# Patient Record
Sex: Male | Born: 1950 | Race: White | Hispanic: No | Marital: Married | Smoking: Never smoker
Health system: Southern US, Community
[De-identification: ages and names within clinical notes are randomized; demographics above are authoritative.]

## PROBLEM LIST (undated history)

## (undated) DIAGNOSIS — I10 Essential (primary) hypertension: Secondary | ICD-10-CM

## (undated) DIAGNOSIS — C61 Malignant neoplasm of prostate: Secondary | ICD-10-CM

## (undated) DIAGNOSIS — E785 Hyperlipidemia, unspecified: Secondary | ICD-10-CM

## (undated) DIAGNOSIS — I214 Non-ST elevation (NSTEMI) myocardial infarction: Secondary | ICD-10-CM

## (undated) HISTORY — DX: Hyperlipidemia, unspecified: E78.5

## (undated) HISTORY — DX: Non-ST elevation (NSTEMI) myocardial infarction: I21.4

## (undated) HISTORY — PX: TONSILLECTOMY: SUR1361

---

## 1985-03-02 HISTORY — PX: TENDON REPAIR: SHX5111

## 2013-03-02 HISTORY — PX: CATARACT EXTRACTION: SUR2

## 2015-06-20 MED FILL — IBUPROFEN 600 MG TABLET: 600 | 10 days supply | Qty: 40 | Fill #0

## 2015-06-20 MED FILL — traMADol HCL 50 MG TABS: 50 | 3 days supply | Qty: 20 | Fill #0

## 2015-06-20 MED FILL — AMOXICILLIN 500 MG CAPSULE: 500 | 7 days supply | Qty: 21 | Fill #0

## 2017-01-26 DIAGNOSIS — I10 Essential (primary) hypertension: Secondary | ICD-10-CM | POA: Diagnosis not present

## 2017-01-26 DIAGNOSIS — Z13220 Encounter for screening for lipoid disorders: Secondary | ICD-10-CM | POA: Diagnosis not present

## 2017-01-26 DIAGNOSIS — Z1211 Encounter for screening for malignant neoplasm of colon: Secondary | ICD-10-CM | POA: Diagnosis not present

## 2017-01-26 DIAGNOSIS — Z131 Encounter for screening for diabetes mellitus: Secondary | ICD-10-CM | POA: Diagnosis not present

## 2017-01-26 DIAGNOSIS — Z0000 Encounter for general adult medical examination without abnormal findings: Secondary | ICD-10-CM | POA: Diagnosis not present

## 2017-01-26 DIAGNOSIS — Z23 Encounter for immunization: Secondary | ICD-10-CM | POA: Diagnosis not present

## 2017-01-26 DIAGNOSIS — Z125 Encounter for screening for malignant neoplasm of prostate: Secondary | ICD-10-CM | POA: Diagnosis not present

## 2017-01-26 MED FILL — CARVEDILOL 12.5 MG TABLET: 12.5 | 30 days supply | Qty: 60 | Fill #0

## 2017-01-27 DIAGNOSIS — I10 Essential (primary) hypertension: Secondary | ICD-10-CM | POA: Diagnosis not present

## 2017-03-04 MED FILL — IBUPROFEN 600 MG TABLET: 600 | 10 days supply | Qty: 40 | Fill #0

## 2017-03-04 MED FILL — AMOXICILLIN 500 MG CAPSULE: 500 | 7 days supply | Qty: 21 | Fill #0

## 2017-03-05 DIAGNOSIS — R972 Elevated prostate specific antigen [PSA]: Secondary | ICD-10-CM | POA: Diagnosis not present

## 2017-03-15 MED FILL — CARVEDILOL 12.5 MG TABLET: 12.5 | 30 days supply | Qty: 60 | Fill #1

## 2017-04-13 DIAGNOSIS — Z1211 Encounter for screening for malignant neoplasm of colon: Secondary | ICD-10-CM | POA: Diagnosis not present

## 2017-04-20 DIAGNOSIS — R972 Elevated prostate specific antigen [PSA]: Secondary | ICD-10-CM | POA: Diagnosis not present

## 2017-04-20 MED FILL — SULFAMETHOXAZOLE-TMP DS TAB: 800-160 | 1 days supply | Qty: 2 | Fill #0

## 2017-04-29 MED FILL — CARVEDILOL 25 MG TABLET: 25 | 90 days supply | Qty: 180 | Fill #0

## 2017-05-13 DIAGNOSIS — R972 Elevated prostate specific antigen [PSA]: Secondary | ICD-10-CM | POA: Diagnosis not present

## 2017-05-13 HISTORY — PX: PROSTATE BIOPSY: SHX241

## 2017-05-31 DIAGNOSIS — C61 Malignant neoplasm of prostate: Secondary | ICD-10-CM | POA: Diagnosis not present

## 2017-06-02 ENCOUNTER — Encounter: Payer: Self-pay | Admitting: Radiation Oncology

## 2017-06-14 ENCOUNTER — Institutional Professional Consult (permissible substitution): Payer: Self-pay | Admitting: Radiation Oncology

## 2017-06-21 ENCOUNTER — Encounter: Payer: Self-pay | Admitting: Medical Oncology

## 2017-06-21 ENCOUNTER — Encounter: Payer: Self-pay | Admitting: Radiation Oncology

## 2017-06-21 ENCOUNTER — Ambulatory Visit
Admission: RE | Admit: 2017-06-21 | Discharge: 2017-06-21 | Disposition: A | Discharge status: Home / Self Care | Payer: 59 | Source: Ambulatory Visit | Attending: Radiation Oncology | Admitting: Radiation Oncology

## 2017-06-21 ENCOUNTER — Other Ambulatory Visit: Payer: Self-pay

## 2017-06-21 VITALS — BP 183/97 | HR 61 | Temp 98.1°F | Resp 18 | Wt 214.2 lb

## 2017-06-21 DIAGNOSIS — R972 Elevated prostate specific antigen [PSA]: Secondary | ICD-10-CM | POA: Diagnosis not present

## 2017-06-21 DIAGNOSIS — Z809 Family history of malignant neoplasm, unspecified: Secondary | ICD-10-CM

## 2017-06-21 DIAGNOSIS — Z800 Family history of malignant neoplasm of digestive organs: Secondary | ICD-10-CM | POA: Diagnosis not present

## 2017-06-21 DIAGNOSIS — C61 Malignant neoplasm of prostate: Secondary | ICD-10-CM

## 2017-06-21 DIAGNOSIS — Z8042 Family history of malignant neoplasm of prostate: Secondary | ICD-10-CM | POA: Insufficient documentation

## 2017-06-21 DIAGNOSIS — Z803 Family history of malignant neoplasm of breast: Secondary | ICD-10-CM | POA: Insufficient documentation

## 2017-06-21 DIAGNOSIS — Z808 Family history of malignant neoplasm of other organs or systems: Secondary | ICD-10-CM | POA: Diagnosis not present

## 2017-06-21 HISTORY — DX: Essential (primary) hypertension: I10

## 2017-06-21 HISTORY — DX: Malignant neoplasm of prostate: C61

## 2017-06-21 NOTE — Progress Notes (Signed)
Introduced myself to Brendan Jackson and his wife as the prostate nurse navigator and my role. He states he was diagnosed in March and has discussed surgery with Dr. Tresa Moore and here today to discuss radiation. He is leaning towards surgery but encouraged to to hear about radiation. I gave them my business card and asked them to call me with questions and or concerns.

## 2017-06-21 NOTE — Progress Notes (Signed)
See progress note under physician encounter. 

## 2017-06-21 NOTE — Progress Notes (Addendum)
GU Location of Tumor / Histology: prostatic adenocarcinoma  If Prostate Cancer, Gleason Score is (4 + 3) and PSA is (13.3) on 04/2017. Prostate volume: 50 mL  Lillia Corporal was referred by his PCP, Teressa Lower (office in [place]),to Dr. Tresa Moore for further evaluation of an elevated PSA and small cyst mid lobe and median lobe in February 2019.   04/2017   PSA  12.38 12/2016  PSA  13.37  Biopsies revealed:    Past/Anticipated interventions by urology, if any:  05/13/2017: PROSTATE NEEDLE BIOPSY, referral to radiation oncology  Past/Anticipated interventions by medical oncology, if any: none  Weight changes, if any: no  Bowel/Bladder complaints, if any:  IPSS 7. Confirms urinary urgency. Denies dysuria or hematuria.    Nausea/Vomiting, if any: no  Pain issues, if any: no   SAFETY ISSUES:  Prior radiation? no  Pacemaker/ICD? no  Possible current pregnancy? No  Is the patient on methotrexate? no  Current Complaints / other details:  67 year old male. Married. Works a Advertising copywriter. Wife has worked in Orthoptist at [employer] for  20+ years. Resides in [place] with his wife and grown daughter.

## 2017-06-21 NOTE — Progress Notes (Signed)
Radiation Oncology         [phone] ________________________________  Initial Outpatient Consultation  Name: Brendan Jackson MRN: [id number]  Date: 06/21/2017  DOB: [birth year]  JI:RCVEL, Jaymes Graff, MD  Alexis Frock, MD   REFERRING PHYSICIAN: Alexis Frock, MD  DIAGNOSIS: 67 y.o. 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 Malignant neoplasm of prostate ([place]) C61   2. Family history of cancer Z80.9     HISTORY OF PRESENT ILLNESS: Brendan Jackson is a 67 y.o. male with a new diagnosis of prostate cancer, seen at the request of Dr. Tresa Moore. He was noted to have a persistently elevated PSA of 13.37 in November 2018 and 12.38 in January 2019 by his primary care physician, Dr. Teressa Lower.  Accordingly, he was referred for evaluation in urology to Dr. Tresa Moore on 04/20/2017, where a digital rectal examination was performed at that time revealing right apical induration.  The patient proceeded to transrectal ultrasound with 12 biopsies of the prostate on 05/13/2017.  The prostate volume measured 50 cc with mid lobe small cyst and early median lobe.  Out of 12 core biopsies, 6 were positive.  The maximum Gleason score was 4+3, and this was seen in the right mid, right apex, right base lateral, right mid lateral, and right apex lateral.    The patient reviewed the biopsy results with his urologist and he has kindly been referred today for discussion of potential radiation treatment options. He is accompanied by his wife who is a Production designer, theatre/television/film in cardiology.   PREVIOUS RADIATION THERAPY: No  PAST MEDICAL HISTORY:  Past Medical History:  Diagnosis Date  . Hypertension   . Prostate cancer ([place])       PAST SURGICAL HISTORY: Past Surgical History:  Procedure Laterality Date  . CATARACT EXTRACTION  2015  . PROSTATE BIOPSY  05/13/2017    FAMILY HISTORY:  Family History  Problem Relation Age of Onset  . Cancer  Paternal Aunt        breast  . Cancer Paternal Uncle        prostate and brain  . Cancer Paternal Uncle        head and neck ca with lung involvement  . Cancer Paternal Aunt        breast  . Cancer Father        pancreatic    SOCIAL HISTORY:  Social History   Socioeconomic History  . Marital status: Married    Spouse name: [name]  . Number of children: 1  . Years of education: Not on file  . Highest education level: Not on file  Occupational History    Comment: chicken and cattle farmer  Social Needs  . Financial resource strain: Not on file  . Food insecurity:    Worry: Not on file    Inability: Not on file  . Transportation needs:    Medical: Not on file    Non-medical: Not on file  Tobacco Use  . Smoking status: Never Smoker  . Smokeless tobacco: Former Systems developer    Types: Chew  Substance and Sexual Activity  . Alcohol use: Never    Frequency: Never  . Drug use: Never  . Sexual activity: Yes  Lifestyle  . Physical activity:    Days per week: Not on file    Minutes per session: Not on file  . Stress: Not on file  Relationships  .  Social connections:    Talks on phone: Not on file    Gets together: Not on file    Attends religious service: Not on file    Active member of club or organization: Not on file    Attends meetings of clubs or organizations: Not on file    Relationship status: Not on file  . Intimate partner violence:    Fear of current or ex partner: Not on file    Emotionally abused: Not on file    Physically abused: Not on file    Forced sexual activity: Not on file  Other Topics Concern  . Not on file  Social History Narrative   Resides in [place] with his wife, grown daughter and dogs, Margaretmary [place], [place] and [place].     ALLERGIES: Patient has no known allergies.  MEDICATIONS:  Current Outpatient Medications  Medication Sig Dispense Refill  . aspirin EC 81 MG tablet Take 81 mg by mouth daily.    . carvedilol (COREG) 12.5 MG tablet Take 12.5 mg by  mouth 2 (two) times daily with a meal.     No current facility-administered medications for this encounter.     REVIEW OF SYSTEMS:  On review of systems, the patient reports that he is doing well overall. He denies any chest pain, shortness of breath, cough, fevers, chills, night sweats, or unintended weight changes. He denies any bowel disturbances, and denies abdominal pain, nausea or vomiting. He denies any new musculoskeletal or joint aches or pains. His IPSS was 7, indicating mild urinary symptoms with urgency. He denies any dysuria or hematuria. He is able to complete sexual activity with most attempts. A complete review of systems is obtained and is otherwise negative.    PHYSICAL EXAM:  Wt Readings from Last 3 Encounters:  06/21/17 214 lb 3.2 oz (97.2 kg)   Temp Readings from Last 3 Encounters:  06/21/17 98.1 F (36.7 C) (Oral)   BP Readings from Last 3 Encounters:  06/21/17 (!) 183/97   Pulse Readings from Last 3 Encounters:  06/21/17 61      In general this is a well appearing caucasian male in no acute distress. He is alert and oriented x4 and appropriate throughout the examination. HEENT reveals that the patient is normocephalic, atraumatic. EOMs are intact. PERRLA. Skin is intact without any evidence of gross lesions. Cardiopulmonary assessment is negative for acute distress and he exhibits normal effort.    KPS = 100  100 - Normal; no complaints; no evidence of disease. 90   - Able to carry on normal activity; minor signs or symptoms of disease. 80   - Normal activity with effort; some signs or symptoms of disease. 51   - Cares for self; unable to carry on normal activity or to do active work. 60   - Requires occasional assistance, but is able to care for most of his personal needs. 50   - Requires considerable assistance and frequent medical care. 1   - Disabled; requires special care and assistance. 60   - Severely disabled; hospital admission is indicated  although death not imminent. 14   - Very sick; hospital admission necessary; active supportive treatment necessary. 10   - Moribund; fatal processes progressing rapidly. 0     - Dead  Karnofsky DA, Abelmann WH, Craver LS and Burchenal [facility] [phone]) The use of the nitrogen mustards in the palliative treatment of carcinoma: with particular reference to bronchogenic carcinoma Cancer 1 [phone]  LABORATORY DATA:  No results  found for: WBC, HGB, HCT, MCV, PLT No results found for: NA, K, CL, CO2 No results found for: ALT, AST, GGT, ALKPHOS, BILITOT   RADIOGRAPHY: No results found.    IMPRESSION/PLAN: 1. 67 y.o. gentleman with Stage T1c adenocarcinoma of the prostate with Gleason Score of 4+3, and PSA of 12.38. We discussed the pathology findings and reviews the nature of prostate cancer highlighting T staging, Gleason Score, and PSA as it pertains to diagnosis and management of prostate cancer. The patient is elligible for surgical resection with prostatectomy, external beam radiotherapy, or radioactive seed implant. We discussed the risks, benefits, short, and long term effects of each approach of radiotherapy, comparing and contrasting surgical resection. At the end of the visit, the patient is going to go home and think about his options. I will call him in the next week or so to see what choice he'll make for treatment.  2. Possible genetic predisposition to malignancy. The patient is a candidate for genetic testing given his personal and family history. He was offered referral and would like to proceed with testing. A referral will be placed.  In a visit lasting 60 minutes, greater than 50% of the time was spent face to face discussing his case, and coordinating the patient's care.    Carola Rhine, [facility]   Page Me  Seen with  _____________________________________  Sheral Apley Tammi Klippel, M.D.  This document serves as a record of services personally performed by Tyler Pita, MD and Shona Simpson, PA-C. It was created on their behalf by Rae Lips, a trained medical scribe. The creation of this record is based on the scribe's personal observations and the providers' statements to them. This document has been checked and approved by the attending providers.

## 2017-06-23 ENCOUNTER — Other Ambulatory Visit: Payer: Self-pay | Admitting: Radiation Oncology

## 2017-06-23 DIAGNOSIS — Z809 Family history of malignant neoplasm, unspecified: Secondary | ICD-10-CM

## 2017-06-24 ENCOUNTER — Telehealth: Payer: Self-pay | Admitting: Radiation Oncology

## 2017-06-24 NOTE — Telephone Encounter (Signed)
Appointments scheduled letter/calendar mailed to patient per 4/25 sch msg

## 2017-06-30 ENCOUNTER — Telehealth: Payer: Self-pay | Admitting: Radiation Oncology

## 2017-06-30 NOTE — Telephone Encounter (Signed)
I called and spoke with the patient to determine if he'd made a decision for treatment yet. He's still considering, though leaning toward surgery and asked that I call back next week to check on him.

## 2017-07-06 DIAGNOSIS — C61 Malignant neoplasm of prostate: Secondary | ICD-10-CM | POA: Diagnosis not present

## 2017-07-06 DIAGNOSIS — R972 Elevated prostate specific antigen [PSA]: Secondary | ICD-10-CM | POA: Diagnosis not present

## 2017-07-12 ENCOUNTER — Telehealth: Payer: Self-pay | Admitting: Medical Oncology

## 2017-07-12 ENCOUNTER — Other Ambulatory Visit: Payer: Self-pay | Admitting: Urology

## 2017-07-12 NOTE — Telephone Encounter (Signed)
Left message asking for a return call regarding treatment decision for his prostate cancer. He has follow up appointment 5/7 with Dr. Tresa Moore to further discuss robotic prostatectomy.

## 2017-07-14 ENCOUNTER — Telehealth: Payer: Self-pay | Admitting: Radiation Oncology

## 2017-07-14 NOTE — Telephone Encounter (Signed)
LM for pt to call back if he had any questions. It appears he's proceeding with surgery on 09/08/17. I encouraged him in the message to call us if he has any questions regarding our last meeting.

## 2017-07-20 DIAGNOSIS — M6281 Muscle weakness (generalized): Secondary | ICD-10-CM | POA: Diagnosis not present

## 2017-07-20 DIAGNOSIS — C61 Malignant neoplasm of prostate: Secondary | ICD-10-CM | POA: Diagnosis not present

## 2017-08-09 DIAGNOSIS — N393 Stress incontinence (female) (male): Secondary | ICD-10-CM | POA: Diagnosis not present

## 2017-08-09 DIAGNOSIS — M6281 Muscle weakness (generalized): Secondary | ICD-10-CM | POA: Diagnosis not present

## 2017-08-09 DIAGNOSIS — M62838 Other muscle spasm: Secondary | ICD-10-CM | POA: Diagnosis not present

## 2017-08-10 ENCOUNTER — Other Ambulatory Visit: Payer: 59

## 2017-08-10 ENCOUNTER — Encounter: Payer: 59 | Admitting: Genetics

## 2017-08-24 DIAGNOSIS — C61 Malignant neoplasm of prostate: Secondary | ICD-10-CM | POA: Diagnosis not present

## 2017-08-26 DIAGNOSIS — Z13220 Encounter for screening for lipoid disorders: Secondary | ICD-10-CM | POA: Diagnosis not present

## 2017-08-26 DIAGNOSIS — I10 Essential (primary) hypertension: Secondary | ICD-10-CM | POA: Diagnosis not present

## 2017-08-26 DIAGNOSIS — Z131 Encounter for screening for diabetes mellitus: Secondary | ICD-10-CM | POA: Diagnosis not present

## 2017-08-26 DIAGNOSIS — C61 Malignant neoplasm of prostate: Secondary | ICD-10-CM | POA: Diagnosis not present

## 2017-08-27 NOTE — Patient Instructions (Signed)
Brendan Jackson  08/27/2017   Your procedure is scheduled on: 09-08-17   Report to [facility] Main  Entrance    Report to Admitting at 6:30 AM    Call this number if you have problems the morning of surgery [phone]   Remember: Do not eat food or drink liquids :After Midnight.   Please follow a Clear Liquid Diet the Day Before Surgery  CLEAR LIQUID DIET   Foods Allowed                                                                     Foods Excluded  Coffee and tea, regular and decaf                             liquids that you cannot  Plain Jell-O in any flavor                                             see through such as: Fruit ices (not with fruit pulp)                                     milk, soups, orange juice  Iced Popsicles                                    All solid food Carbonated beverages, regular and diet                                    Cranberry, grape and apple juices Sports drinks like Gatorade Lightly seasoned clear broth or consume(fat free) Sugar, honey syrup  Sample Menu Breakfast                                Lunch                                     Supper Cranberry juice                    Beef broth                            Chicken broth Jell-O                                     Grape juice                           Apple juice Coffee or tea  Jell-O                                      Popsicle                                                Coffee or tea                        Coffee or tea  _____________________________________________________________________     Take these medicines the morning of surgery with A SIP OF WATER: Carvedilol (Coreg)                                You may not have any metal on your body including hair pins and              piercings  Do not wear jewelry, lotions, powders or deodorant             Men may shave face and neck.   Do not bring valuables to the  hospital. [place].  Contacts, dentures or bridgework may not be worn into surgery.  Leave suitcase in the car. After surgery it may be brought to your room.      Special Instructions: N/A              Please read over the following fact sheets you were given: _____________________________________________________________________           [facility] - Preparing for Surgery Before surgery, you can play an important role.  Because skin is not sterile, your skin needs to be as free of germs as possible.  You can reduce the number of germs on your skin by washing with CHG (chlorahexidine gluconate) soap before surgery.  CHG is an antiseptic cleaner which kills germs and bonds with the skin to continue killing germs even after washing. Please DO NOT use if you have an allergy to CHG or antibacterial soaps.  If your skin becomes reddened/irritated stop using the CHG and inform your nurse when you arrive at Short Stay. Do not shave (including legs and underarms) for at least 48 hours prior to the first CHG shower.  You may shave your face/neck. Please follow these instructions carefully:  1.  Shower with CHG Soap the night before surgery and the  morning of Surgery.  2.  If you choose to wash your hair, wash your hair first as usual with your  normal  shampoo.  3.  After you shampoo, rinse your hair and body thoroughly to remove the  shampoo.                           4.  Use CHG as you would any other liquid soap.  You can apply chg directly  to the skin and wash                       Gently with a scrungie or clean washcloth.  5.  Apply the CHG Soap to your body ONLY FROM  THE NECK DOWN.   Do not use on face/ open                           Wound or open sores. Avoid contact with eyes, ears mouth and genitals (private parts).                       Wash face,  Genitals (private parts) with your normal soap.             6.  Wash thoroughly, paying  special attention to the area where your surgery  will be performed.  7.  Thoroughly rinse your body with warm water from the neck down.  8.  DO NOT shower/wash with your normal soap after using and rinsing off  the CHG Soap.                9.  Pat yourself dry with a clean towel.            10.  Wear clean pajamas.            11.  Place clean sheets on your bed the night of your first shower and do not  sleep with pets. Day of Surgery : Do not apply any lotions/deodorants the morning of surgery.  Please wear clean clothes to the hospital/surgery center.  FAILURE TO FOLLOW THESE INSTRUCTIONS MAY RESULT IN THE CANCELLATION OF YOUR SURGERY PATIENT SIGNATURE_________________________________  NURSE SIGNATURE__________________________________  ________________________________________________________________________  WHAT IS A BLOOD TRANSFUSION? Blood Transfusion Information  A transfusion is the replacement of blood or some of its parts. Blood is made up of multiple cells which provide different functions.  Red blood cells carry oxygen and are used for blood loss replacement.  White blood cells fight against infection.  Platelets control bleeding.  Plasma helps clot blood.  Other blood products are available for specialized needs, such as hemophilia or other clotting disorders. BEFORE THE TRANSFUSION  Who gives blood for transfusions?   Healthy volunteers who are fully evaluated to make sure their blood is safe. This is blood bank blood. Transfusion therapy is the safest it has ever been in the practice of medicine. Before blood is taken from a donor, a complete history is taken to make sure that person has no history of diseases nor engages in risky social behavior (examples are intravenous drug use or sexual activity with multiple partners). The donor's travel history is screened to minimize risk of transmitting infections, such as malaria. The donated blood is tested for signs of  infectious diseases, such as HIV and hepatitis. The blood is then tested to be sure it is compatible with you in order to minimize the chance of a transfusion reaction. If you or a relative donates blood, this is often done in anticipation of surgery and is not appropriate for emergency situations. It takes many days to process the donated blood. RISKS AND COMPLICATIONS Although transfusion therapy is very safe and saves many lives, the main dangers of transfusion include:   Getting an infectious disease.  Developing a transfusion reaction. This is an allergic reaction to something in the blood you were given. Every precaution is taken to prevent this. The decision to have a blood transfusion has been considered carefully by your caregiver before blood is given. Blood is not given unless the benefits outweigh the risks. AFTER THE TRANSFUSION  Right after receiving a blood transfusion, you will usually feel much better  and more energetic. This is especially true if your red blood cells have gotten low (anemic). The transfusion raises the level of the red blood cells which carry oxygen, and this usually causes an energy increase.  The nurse administering the transfusion will monitor you carefully for complications. HOME CARE INSTRUCTIONS  No special instructions are needed after a transfusion. You may find your energy is better. Speak with your caregiver about any limitations on activity for underlying diseases you may have. SEEK MEDICAL CARE IF:   Your condition is not improving after your transfusion.  You develop redness or irritation at the intravenous (IV) site. SEEK IMMEDIATE MEDICAL CARE IF:  Any of the following symptoms occur over the next 12 hours:  Shaking chills.  You have a temperature by mouth above 102 F (38.9 C), not controlled by medicine.  Chest, back, or muscle pain.  People around you feel you are not acting correctly or are confused.  Shortness of breath or  difficulty breathing.  Dizziness and fainting.  You get a rash or develop hives.  You have a decrease in urine output.  Your urine turns a dark color or changes to pink, red, or brown. Any of the following symptoms occur over the next 10 days:  You have a temperature by mouth above 102 F (38.9 C), not controlled by medicine.  Shortness of breath.  Weakness after normal activity.  The white part of the eye turns yellow (jaundice).  You have a decrease in the amount of urine or are urinating less often.  Your urine turns a dark color or changes to pink, red, or brown. Document Released: 02/14/2000 Document Revised: 05/11/2011 Document Reviewed: 10/03/2007 ExitCare Patient Information 2014 ExitCare, [place].  _______________________________________________________________________ WHAT IS A BLOOD TRANSFUSION? Blood Transfusion Information  A transfusion is the replacement of blood or some of its parts. Blood is made up of multiple cells which provide different functions.  Red blood cells carry oxygen and are used for blood loss replacement.  White blood cells fight against infection.  Platelets control bleeding.  Plasma helps clot blood.  Other blood products are available for specialized needs, such as hemophilia or other clotting disorders. BEFORE THE TRANSFUSION  Who gives blood for transfusions?   Healthy volunteers who are fully evaluated to make sure their blood is safe. This is blood bank blood. Transfusion therapy is the safest it has ever been in the practice of medicine. Before blood is taken from a donor, a complete history is taken to make sure that person has no history of diseases nor engages in risky social behavior (examples are intravenous drug use or sexual activity with multiple partners). The donor's travel history is screened to minimize risk of transmitting infections, such as malaria. The donated blood is tested for signs of infectious diseases, such as  HIV and hepatitis. The blood is then tested to be sure it is compatible with you in order to minimize the chance of a transfusion reaction. If you or a relative donates blood, this is often done in anticipation of surgery and is not appropriate for emergency situations. It takes many days to process the donated blood. RISKS AND COMPLICATIONS Although transfusion therapy is very safe and saves many lives, the main dangers of transfusion include:   Getting an infectious disease.  Developing a transfusion reaction. This is an allergic reaction to something in the blood you were given. Every precaution is taken to prevent this. The decision to have a blood transfusion has been considered  carefully by your caregiver before blood is given. Blood is not given unless the benefits outweigh the risks. AFTER THE TRANSFUSION  Right after receiving a blood transfusion, you will usually feel much better and more energetic. This is especially true if your red blood cells have gotten low (anemic). The transfusion raises the level of the red blood cells which carry oxygen, and this usually causes an energy increase.  The nurse administering the transfusion will monitor you carefully for complications. HOME CARE INSTRUCTIONS  No special instructions are needed after a transfusion. You may find your energy is better. Speak with your caregiver about any limitations on activity for underlying diseases you may have. SEEK MEDICAL CARE IF:   Your condition is not improving after your transfusion.  You develop redness or irritation at the intravenous (IV) site. SEEK IMMEDIATE MEDICAL CARE IF:  Any of the following symptoms occur over the next 12 hours:  Shaking chills.  You have a temperature by mouth above 102 F (38.9 C), not controlled by medicine.  Chest, back, or muscle pain.  People around you feel you are not acting correctly or are confused.  Shortness of breath or difficulty breathing.  Dizziness  and fainting.  You get a rash or develop hives.  You have a decrease in urine output.  Your urine turns a dark color or changes to pink, red, or brown. Any of the following symptoms occur over the next 10 days:  You have a temperature by mouth above 102 F (38.9 C), not controlled by medicine.  Shortness of breath.  Weakness after normal activity.  The white part of the eye turns yellow (jaundice).  You have a decrease in the amount of urine or are urinating less often.  Your urine turns a dark color or changes to pink, red, or brown. Document Released: 02/14/2000 Document Revised: 05/11/2011 Document Reviewed: 10/03/2007 [facility] Patient Information 2014 [place], [place].  _______________________________________________________________________

## 2017-08-31 ENCOUNTER — Encounter (HOSPITAL_COMMUNITY): Payer: Self-pay

## 2017-08-31 ENCOUNTER — Encounter (HOSPITAL_COMMUNITY)
Admission: RE | Admit: 2017-08-31 | Discharge: 2017-08-31 | Disposition: A | Discharge status: Home / Self Care | Payer: 59 | Source: Ambulatory Visit | Attending: Urology | Admitting: Urology

## 2017-08-31 ENCOUNTER — Other Ambulatory Visit: Payer: Self-pay

## 2017-08-31 DIAGNOSIS — Z01812 Encounter for preprocedural laboratory examination: Secondary | ICD-10-CM | POA: Insufficient documentation

## 2017-08-31 DIAGNOSIS — I10 Essential (primary) hypertension: Secondary | ICD-10-CM | POA: Insufficient documentation

## 2017-08-31 DIAGNOSIS — C61 Malignant neoplasm of prostate: Secondary | ICD-10-CM | POA: Insufficient documentation

## 2017-08-31 LAB — BASIC METABOLIC PANEL
Anion gap: 6 (ref 5–15)
BUN: 14 mg/dL (ref 8–23)
CHLORIDE: 108 mmol/L (ref 98–111)
CO2: 27 mmol/L (ref 22–32)
CREATININE: 0.92 mg/dL (ref 0.61–1.24)
Calcium: 9.2 mg/dL (ref 8.9–10.3)
GFR calc Af Amer: >60 mL/min (ref >60)
GFR calc non Af Amer: >60 mL/min (ref >60)
Glucose, Bld: 107 mg/dL — ABNORMAL HIGH (ref 70–99)
Potassium: 4.6 mmol/L (ref 3.5–5.1)
SODIUM: 141 mmol/L (ref 135–145)

## 2017-08-31 LAB — CBC
HCT: 45.3 % (ref 39.0–52.0)
Hemoglobin: 15.4 g/dL (ref 13.0–17.0)
MCH: 33 pg (ref 26.0–34.0)
MCHC: 34 g/dL (ref 30.0–36.0)
MCV: 97.2 fL (ref 78.0–100.0)
PLATELETS: 189 10*3/uL (ref 150–400)
RBC: 4.66 MIL/uL (ref 4.22–5.81)
RDW: 13.3 % (ref 11.5–15.5)
WBC: 6.8 10*3/uL (ref 4.0–10.5)

## 2017-08-31 LAB — ABO/RH: ABO/RH(D): AB POS

## 2017-09-03 MED FILL — CARVEDILOL 25 MG TABLET: 25 | 90 days supply | Qty: 180 | Fill #1

## 2017-09-05 ENCOUNTER — Inpatient Hospital Stay (HOSPITAL_COMMUNITY)
Admission: AD | Admit: 2017-09-05 | Discharge: 2017-09-14 | DRG: 234 | Disposition: A | Discharge status: Home Health | Payer: 59 | Source: Other Acute Inpatient Hospital | Attending: Cardiovascular Disease | Admitting: Cardiovascular Disease

## 2017-09-05 DIAGNOSIS — K5900 Constipation, unspecified: Secondary | ICD-10-CM | POA: Diagnosis not present

## 2017-09-05 DIAGNOSIS — E8770 Fluid overload, unspecified: Secondary | ICD-10-CM | POA: Diagnosis not present

## 2017-09-05 DIAGNOSIS — I2510 Atherosclerotic heart disease of native coronary artery without angina pectoris: Secondary | ICD-10-CM | POA: Diagnosis not present

## 2017-09-05 DIAGNOSIS — Z8249 Family history of ischemic heart disease and other diseases of the circulatory system: Secondary | ICD-10-CM | POA: Diagnosis not present

## 2017-09-05 DIAGNOSIS — E669 Obesity, unspecified: Secondary | ICD-10-CM | POA: Diagnosis present

## 2017-09-05 DIAGNOSIS — J9811 Atelectasis: Secondary | ICD-10-CM | POA: Diagnosis not present

## 2017-09-05 DIAGNOSIS — I491 Atrial premature depolarization: Secondary | ICD-10-CM | POA: Diagnosis not present

## 2017-09-05 DIAGNOSIS — I214 Non-ST elevation (NSTEMI) myocardial infarction: Secondary | ICD-10-CM | POA: Diagnosis not present

## 2017-09-05 DIAGNOSIS — D62 Acute posthemorrhagic anemia: Secondary | ICD-10-CM | POA: Diagnosis not present

## 2017-09-05 DIAGNOSIS — Z09 Encounter for follow-up examination after completed treatment for conditions other than malignant neoplasm: Secondary | ICD-10-CM

## 2017-09-05 DIAGNOSIS — Z4682 Encounter for fitting and adjustment of non-vascular catheter: Secondary | ICD-10-CM | POA: Diagnosis not present

## 2017-09-05 DIAGNOSIS — I25110 Atherosclerotic heart disease of native coronary artery with unstable angina pectoris: Secondary | ICD-10-CM | POA: Diagnosis not present

## 2017-09-05 DIAGNOSIS — I213 ST elevation (STEMI) myocardial infarction of unspecified site: Secondary | ICD-10-CM | POA: Diagnosis present

## 2017-09-05 DIAGNOSIS — Z951 Presence of aortocoronary bypass graft: Secondary | ICD-10-CM | POA: Diagnosis not present

## 2017-09-05 DIAGNOSIS — I10 Essential (primary) hypertension: Secondary | ICD-10-CM | POA: Diagnosis not present

## 2017-09-05 DIAGNOSIS — Z79899 Other long term (current) drug therapy: Secondary | ICD-10-CM | POA: Diagnosis not present

## 2017-09-05 DIAGNOSIS — Z7982 Long term (current) use of aspirin: Secondary | ICD-10-CM | POA: Diagnosis not present

## 2017-09-05 DIAGNOSIS — I340 Nonrheumatic mitral (valve) insufficiency: Secondary | ICD-10-CM | POA: Diagnosis not present

## 2017-09-05 DIAGNOSIS — Z87891 Personal history of nicotine dependence: Secondary | ICD-10-CM

## 2017-09-05 DIAGNOSIS — Z8042 Family history of malignant neoplasm of prostate: Secondary | ICD-10-CM

## 2017-09-05 DIAGNOSIS — D6959 Other secondary thrombocytopenia: Secondary | ICD-10-CM | POA: Diagnosis not present

## 2017-09-05 DIAGNOSIS — Z01810 Encounter for preprocedural cardiovascular examination: Secondary | ICD-10-CM | POA: Diagnosis not present

## 2017-09-05 DIAGNOSIS — Z6832 Body mass index (BMI) 32.0-32.9, adult: Secondary | ICD-10-CM

## 2017-09-05 DIAGNOSIS — I083 Combined rheumatic disorders of mitral, aortic and tricuspid valves: Secondary | ICD-10-CM | POA: Diagnosis not present

## 2017-09-05 DIAGNOSIS — I454 Nonspecific intraventricular block: Secondary | ICD-10-CM | POA: Diagnosis not present

## 2017-09-05 DIAGNOSIS — C61 Malignant neoplasm of prostate: Secondary | ICD-10-CM | POA: Diagnosis present

## 2017-09-05 DIAGNOSIS — I351 Nonrheumatic aortic (valve) insufficiency: Secondary | ICD-10-CM | POA: Diagnosis not present

## 2017-09-05 DIAGNOSIS — E782 Mixed hyperlipidemia: Secondary | ICD-10-CM | POA: Diagnosis not present

## 2017-09-05 DIAGNOSIS — Z9689 Presence of other specified functional implants: Secondary | ICD-10-CM

## 2017-09-05 DIAGNOSIS — R079 Chest pain, unspecified: Secondary | ICD-10-CM

## 2017-09-05 HISTORY — DX: Non-ST elevation (NSTEMI) myocardial infarction: I21.4

## 2017-09-06 ENCOUNTER — Encounter (HOSPITAL_COMMUNITY)
Admission: AD | Disposition: A | Discharge status: Home Health | Payer: Self-pay | Source: Other Acute Inpatient Hospital | Attending: Cardiothoracic Surgery

## 2017-09-06 ENCOUNTER — Other Ambulatory Visit (HOSPITAL_COMMUNITY): Payer: Medicare Other

## 2017-09-06 ENCOUNTER — Encounter (HOSPITAL_COMMUNITY): Payer: Self-pay | Admitting: *Deleted

## 2017-09-06 ENCOUNTER — Other Ambulatory Visit: Payer: Self-pay | Admitting: *Deleted

## 2017-09-06 ENCOUNTER — Other Ambulatory Visit: Payer: Self-pay

## 2017-09-06 DIAGNOSIS — I214 Non-ST elevation (NSTEMI) myocardial infarction: Secondary | ICD-10-CM

## 2017-09-06 DIAGNOSIS — I2510 Atherosclerotic heart disease of native coronary artery without angina pectoris: Secondary | ICD-10-CM

## 2017-09-06 DIAGNOSIS — I10 Essential (primary) hypertension: Secondary | ICD-10-CM | POA: Diagnosis present

## 2017-09-06 DIAGNOSIS — I25110 Atherosclerotic heart disease of native coronary artery with unstable angina pectoris: Secondary | ICD-10-CM

## 2017-09-06 HISTORY — PX: LEFT HEART CATH AND CORONARY ANGIOGRAPHY: CATH118249

## 2017-09-06 LAB — URINALYSIS, ROUTINE W REFLEX MICROSCOPIC
Bilirubin Urine: NEGATIVE
Glucose, UA: NEGATIVE mg/dL
Hgb urine dipstick: NEGATIVE
Ketones, ur: NEGATIVE mg/dL
Leukocytes, UA: NEGATIVE
Nitrite: NEGATIVE
Protein, ur: NEGATIVE mg/dL
Specific Gravity, Urine: 1.03 (ref 1.005–1.030)
pH: 7 (ref 5.0–8.0)

## 2017-09-06 LAB — CBC
HEMATOCRIT: 43.1 % (ref 39.0–52.0)
Hemoglobin: 14.3 g/dL (ref 13.0–17.0)
MCH: 32.1 pg (ref 26.0–34.0)
MCHC: 33.2 g/dL (ref 30.0–36.0)
MCV: 96.9 fL (ref 78.0–100.0)
Platelets: 192 10*3/uL (ref 150–400)
RBC: 4.45 MIL/uL (ref 4.22–5.81)
RDW: 13.2 % (ref 11.5–15.5)
WBC: 9.4 10*3/uL (ref 4.0–10.5)

## 2017-09-06 LAB — LIPID PANEL
CHOL/HDL RATIO: 4.2 ratio
Cholesterol: 157 mg/dL (ref 0–200)
HDL: 37 mg/dL — ABNORMAL LOW (ref >40)
LDL CALC: 108 mg/dL — AB (ref 0–99)
Triglycerides: 61 mg/dL (ref <150)
VLDL: 12 mg/dL (ref 0–40)

## 2017-09-06 LAB — BASIC METABOLIC PANEL
ANION GAP: 10 (ref 5–15)
BUN: 11 mg/dL (ref 8–23)
CHLORIDE: 104 mmol/L (ref 98–111)
CO2: 27 mmol/L (ref 22–32)
Calcium: 9.6 mg/dL (ref 8.9–10.3)
Creatinine, Ser: 0.99 mg/dL (ref 0.61–1.24)
GFR calc non Af Amer: >60 mL/min (ref >60)
GLUCOSE: 117 mg/dL — AB (ref 70–99)
Potassium: 3.8 mmol/L (ref 3.5–5.1)
Sodium: 141 mmol/L (ref 135–145)

## 2017-09-06 LAB — TROPONIN I
TROPONIN I: 0.81 ng/mL — AB (ref <0.03)
Troponin I: 0.78 ng/mL (ref <0.03)
Troponin I: 0.77 ng/mL (ref <0.03)

## 2017-09-06 LAB — TSH: TSH: 1.982 u[IU]/mL (ref 0.350–4.500)

## 2017-09-06 LAB — HEMOGLOBIN A1C
Hgb A1c MFr Bld: 5.5 % (ref 4.8–5.6)
MEAN PLASMA GLUCOSE: 111.15 mg/dL

## 2017-09-06 LAB — HIV ANTIBODY (ROUTINE TESTING W REFLEX): HIV Screen 4th Generation wRfx: NONREACTIVE

## 2017-09-06 LAB — HEPARIN LEVEL (UNFRACTIONATED)
Heparin Unfractionated: 0.41 IU/mL (ref 0.30–0.70)
Heparin Unfractionated: 0.41 IU/mL (ref 0.30–0.70)

## 2017-09-06 SURGERY — LEFT HEART CATH AND CORONARY ANGIOGRAPHY
Anesthesia: LOCAL

## 2017-09-06 MED ORDER — NITROGLYCERIN 0.4 MG SL SUBL: 0.4000 mg | SUBLINGUAL_TABLET | SUBLINGUAL | Status: DC | PRN

## 2017-09-06 MED ORDER — SODIUM CHLORIDE 0.9 % WEIGHT BASED INFUSION
1.0000 mL/kg/h | INTRAVENOUS | Status: DC
  Administered 2017-09-06: 1 mL/kg/h via INTRAVENOUS

## 2017-09-06 MED ORDER — CARVEDILOL 12.5 MG PO TABS: 12.5000 mg | ORAL_TABLET | Freq: Two times a day (BID) | ORAL | Status: DC

## 2017-09-06 MED ORDER — SODIUM CHLORIDE 0.9% FLUSH: 3.0000 mL | INTRAVENOUS | Status: DC | PRN

## 2017-09-06 MED ORDER — FENTANYL CITRATE (PF) 100 MCG/2ML IJ SOLN
INTRAMUSCULAR | Status: DC | PRN
  Administered 2017-09-06: 25 ug via INTRAVENOUS

## 2017-09-06 MED ORDER — CARVEDILOL 25 MG PO TABS: 50.0000 mg | ORAL_TABLET | Freq: Two times a day (BID) | ORAL | Status: DC

## 2017-09-06 MED ORDER — VERAPAMIL HCL 2.5 MG/ML IV SOLN
INTRA_ARTERIAL | Status: DC | PRN
  Administered 2017-09-06: 16:00:00 via INTRA_ARTERIAL

## 2017-09-06 MED ORDER — FENTANYL CITRATE (PF) 100 MCG/2ML IJ SOLN
INTRAMUSCULAR | Status: AC
  Filled 2017-09-06: qty 2

## 2017-09-06 MED ORDER — ONDANSETRON HCL 4 MG/2ML IJ SOLN: 4.0000 mg | Freq: Four times a day (QID) | INTRAMUSCULAR | Status: DC | PRN

## 2017-09-06 MED ORDER — CARVEDILOL 3.125 MG PO TABS
3.1250 mg | ORAL_TABLET | Freq: Two times a day (BID) | ORAL | Status: DC
  Administered 2017-09-06: 3.125 mg via ORAL
  Filled 2017-09-06 (×3): qty 1

## 2017-09-06 MED ORDER — HEPARIN (PORCINE) IN NACL 1000-0.9 UT/500ML-% IV SOLN
INTRAVENOUS | Status: AC
  Filled 2017-09-06: qty 1000

## 2017-09-06 MED ORDER — MORPHINE SULFATE (PF) 2 MG/ML IV SOLN: 2.0000 mg | INTRAVENOUS | Status: DC | PRN

## 2017-09-06 MED ORDER — LIDOCAINE HCL (PF) 1 % IJ SOLN
INTRAMUSCULAR | Status: DC | PRN
  Administered 2017-09-06: 2 mL

## 2017-09-06 MED ORDER — HEPARIN (PORCINE) IN NACL 1000-0.9 UT/500ML-% IV SOLN
INTRAVENOUS | Status: DC | PRN
  Administered 2017-09-06: 1000 mL

## 2017-09-06 MED ORDER — SODIUM CHLORIDE 0.9 % IV SOLN: 250.0000 mL | INTRAVENOUS | Status: DC | PRN

## 2017-09-06 MED ORDER — VERAPAMIL HCL 2.5 MG/ML IV SOLN
INTRAVENOUS | Status: AC
  Filled 2017-09-06: qty 2

## 2017-09-06 MED ORDER — ATORVASTATIN CALCIUM 80 MG PO TABS
80.0000 mg | ORAL_TABLET | Freq: Every day | ORAL | Status: DC
  Administered 2017-09-06 – 2017-09-13 (×8): 80 mg via ORAL
  Filled 2017-09-06 (×8): qty 1

## 2017-09-06 MED ORDER — SODIUM CHLORIDE 0.9% FLUSH
3.0000 mL | Freq: Two times a day (BID) | INTRAVENOUS | Status: DC
  Administered 2017-09-06: 3 mL via INTRAVENOUS

## 2017-09-06 MED ORDER — HEPARIN SODIUM (PORCINE) 1000 UNIT/ML IJ SOLN
INTRAMUSCULAR | Status: DC | PRN
  Administered 2017-09-06: 5000 [IU] via INTRAVENOUS

## 2017-09-06 MED ORDER — HEPARIN (PORCINE) IN NACL 100-0.45 UNIT/ML-% IJ SOLN
1400.0000 [IU]/h | INTRAMUSCULAR | Status: DC
  Administered 2017-09-07: 1400 [IU]/h via INTRAVENOUS
  Administered 2017-09-07: 1200 [IU]/h via INTRAVENOUS
  Administered 2017-09-08: 1400 [IU]/h via INTRAVENOUS
  Filled 2017-09-06 (×3): qty 250

## 2017-09-06 MED ORDER — SODIUM CHLORIDE 0.9% FLUSH
3.0000 mL | Freq: Two times a day (BID) | INTRAVENOUS | Status: DC
  Administered 2017-09-07 – 2017-09-08 (×3): 3 mL via INTRAVENOUS

## 2017-09-06 MED ORDER — HEPARIN (PORCINE) IN NACL 100-0.45 UNIT/ML-% IJ SOLN: 1200.0000 [IU]/h | INTRAMUSCULAR | Status: DC

## 2017-09-06 MED ORDER — SODIUM CHLORIDE 0.9 % IV SOLN
INTRAVENOUS | Status: AC
  Administered 2017-09-06: 18:00:00 via INTRAVENOUS

## 2017-09-06 MED ORDER — MIDAZOLAM HCL 2 MG/2ML IJ SOLN
INTRAMUSCULAR | Status: AC
  Filled 2017-09-06: qty 2

## 2017-09-06 MED ORDER — SODIUM CHLORIDE 0.9 % WEIGHT BASED INFUSION
3.0000 mL/kg/h | INTRAVENOUS | Status: DC
  Administered 2017-09-06: 3 mL/kg/h via INTRAVENOUS

## 2017-09-06 MED ORDER — MIDAZOLAM HCL 2 MG/2ML IJ SOLN
INTRAMUSCULAR | Status: DC | PRN
  Administered 2017-09-06: 1 mg via INTRAVENOUS

## 2017-09-06 MED ORDER — IOPAMIDOL (ISOVUE-370) INJECTION 76%
INTRAVENOUS | Status: DC | PRN
  Administered 2017-09-06: 55 mL via INTRA_ARTERIAL

## 2017-09-06 MED ORDER — NITROGLYCERIN 1 MG/10 ML FOR IR/CATH LAB
INTRA_ARTERIAL | Status: AC
  Filled 2017-09-06: qty 10

## 2017-09-06 MED ORDER — ACETAMINOPHEN 325 MG PO TABS: 650.0000 mg | ORAL_TABLET | ORAL | Status: DC | PRN

## 2017-09-06 MED ORDER — ASPIRIN EC 81 MG PO TBEC
81.0000 mg | DELAYED_RELEASE_TABLET | Freq: Every day | ORAL | Status: DC
  Administered 2017-09-06 – 2017-09-08 (×3): 81 mg via ORAL
  Filled 2017-09-06 (×3): qty 1

## 2017-09-06 MED ORDER — HEPARIN SODIUM (PORCINE) 1000 UNIT/ML IJ SOLN
INTRAMUSCULAR | Status: AC
  Filled 2017-09-06: qty 1

## 2017-09-06 MED ORDER — LIDOCAINE HCL (PF) 1 % IJ SOLN
INTRAMUSCULAR | Status: AC
  Filled 2017-09-06: qty 30

## 2017-09-06 MED ORDER — ASPIRIN 81 MG PO CHEW: 81.0000 mg | CHEWABLE_TABLET | Freq: Every day | ORAL | Status: DC

## 2017-09-06 MED ORDER — ATORVASTATIN CALCIUM 80 MG PO TABS: 80.0000 mg | ORAL_TABLET | Freq: Every day | ORAL | Status: DC

## 2017-09-06 SURGICAL SUPPLY — 12 items

## 2017-09-06 NOTE — Plan of Care (Signed)
  Problem: Education: Goal: Knowledge of General Education information will improve Outcome: Progressing   

## 2017-09-06 NOTE — H&P (Signed)
History and Physical  Primary Cardiologist:N/A PCP: Algis Greenhouse, MD  Chief Complaint: Chest Pain  HPI:  This is a 67 year old man with history of hypertension and prostate cancer with upcoming planned prostate surgery presenting to [facility] with chest pain.  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  His wife works in Orthoptist for [employer].   He woke up this morning at 3 AM with central chest pressure that he thought was possibly indigestion.  It returned later that morning at 9 AM and later abated.  He did not eat all day thinking it was his indigestion but come 5 PM it returned, given its recurrence without eating he was concerned and presented to [facility]  With aspirin and half an inch of Nitropaste his symptoms abated.  Also was started on heparin.  Initial troponin elevated 0.66 and ECG with normal sinus rhythm and T wave inversions laterally.  He takes carvedilol for hypertension and normally takes an aspirin but this is held in preparation for an upcoming prostate surgery.  He notes that he is very anxious about this thinking about it all the time.  Never smoker, 2 beers nightly, no illicits.  Has positive family history of brother dying from an MI at age 89.  Not on cholesterol medications.  On my interview he is asymptomatic and has been so for many hours.  No other prodromal chest pain episodes over the last week.  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   Past Medical History:  Diagnosis Date  . Hypertension   . Prostate cancer [facility])     Past Surgical History:  Procedure Laterality Date  . CATARACT EXTRACTION  2015  . PROSTATE BIOPSY  05/13/2017  . TENDON REPAIR Right 1987   Thumb  . TONSILLECTOMY      Family History  Problem Relation Age of Onset  . Cancer Paternal Aunt        breast  . Cancer Paternal Uncle        prostate and brain  . Cancer Paternal Uncle    head and neck ca with lung involvement  . Cancer Paternal Aunt        breast  . Cancer Father        pancreatic   Social History:  reports that he has never smoked. He quit smokeless tobacco use about 4 years ago. His smokeless tobacco use included chew. He reports that he drinks about 0.6 oz of alcohol per week. He reports that he does not use drugs.  Allergies: No Known Allergies  No current facility-administered medications on file prior to encounter.    Current Outpatient Medications on File Prior to Encounter  Medication Sig Dispense Refill  . aspirin EC 81 MG tablet Take 81 mg by mouth daily.    . carvedilol (COREG) 12.5 MG tablet Take 12.5 mg by mouth 2 (two) times daily with a meal.     . ibuprofen (ADVIL,MOTRIN) 200 MG tablet Take 400 mg by mouth every 6 (six) hours as needed for mild pain.      No results found for this or any previous visit (from the past 48 hour(s)). No results found.  ECG/Tele: NSR with lateral ST changes and TWI  ROS: As above. Otherwise, review of systems is negative unless per above HPI  Vitals:   09/05/17 2358  BP: 120/79  Pulse: (!) 58  Temp: 98.4 F (  36.9 C)  TempSrc: Oral  SpO2: 96%   Wt Readings from Last 10 Encounters:  08/31/17 96.3 kg (212 lb 4 oz)  06/21/17 97.2 kg (214 lb 3.2 oz)    PE:  General: No acute distress HEENT: Atraumatic, EOMI, mucous membranes moist. No JVD at 45 degrees. No HJR. CV: RRR no murmurs, gallops.  Respiratory: Clear, no crackles. Normal work of breathing ABD: Non-distended and non-tender. No palpable organomegaly.  Extremities: 2+ radial pulses bilaterally. 0 edema. Neuro/Psych: CN grossly intact, alert and oriented  Assessment/Plan Chest pain and + trop, lateral TWI HTN Prostate Cancer  NSTEMI - S/P 325 ASA, IV UF Heparin - TTE ordered - Tele bed - NPO q midnight as a precaution; timing of ischemic evaluation pending clinical course, suspect will need catheterization-- if stent needed need to  consider prostate surgery timing - Home Coreg, Starting atorvastatin (lipids drawn)  Full Code  Lolita Cram Means  MD 09/06/2017, 12:22 AM

## 2017-09-06 NOTE — Progress Notes (Signed)
Dr. Burt Knack updated of bleeding on cath site. Added total of 4 cc air to make 7 cc in TR band. Per MD heparin to start 2 hrs after TR band removal completed.

## 2017-09-06 NOTE — Progress Notes (Signed)
ANTICOAGULATION CONSULT NOTE  Pharmacy Consult for heparin Indication: CAD awaiting CABG  No Known Allergies  Patient Measurements: Height: 5\' 8"  (172.7 cm) Weight: 214 lb (97.1 kg) IBW/kg (Calculated) : 68.4 Heparin Dosing Weight: 89kg   Vital Signs: Temp: 98.7 F (37.1 C) (07/08 1322) Temp Source: Oral (07/08 1322) BP: 125/75 (07/08 1608) Pulse Rate: 58 (07/08 1608)  Labs: Recent Labs    09/06/17 0218 09/06/17 0718 09/06/17 1212  HGB 14.3  --   --   HCT 43.1  --   --   PLT 192  --   --   HEPARINUNFRC 0.41 0.41  --   CREATININE 0.99  --   --   TROPONINI 0.78* 0.81* 0.77*    Estimated Creatinine Clearance: 84.1 mL/min (by C-G formula based on SCr of 0.99 mg/dL).  Assessment: 67 YOM transferred from [place] with chest pain, troponin elevated at 0.66. He was started on heparin at OSH- given 5000 unit bolus and then started on infusion at 1200 units/hr.  Heparin level therapeutic this AM  Now s/p cath lab, pt with significant CAD awaiting consult for CABG.  Pharmacy asked to resume heparin 2 hrs after sheath removed.  Removed at 1615 PM.    Goal of Therapy:  Heparin level 0.3-0.7 units/ml Monitor platelets by anticoagulation protocol: Yes   Plan:  Restart heparin at 1200 units/hr at 1815 PM. Check heparin level 6 hrs after gtt resumes. Daily heparin level and CBC. F/u plans for CABG.  Marguerite Olea, [facility] Clinical Pharmacist Phone [phone]  09/06/2017 4:34 PM

## 2017-09-06 NOTE — Progress Notes (Addendum)
Progress Note  Patient Name: Brendan Jackson Date of Encounter: 09/06/2017  Primary Cardiologist: No primary care provider on file.  Subjective   Feeling well this morning, no recurrent chest pain.   Inpatient Medications    Scheduled Meds: . aspirin EC  81 mg Oral Daily  . atorvastatin  80 mg Oral q1800  . carvedilol  50 mg Oral BID WC   Continuous Infusions: . heparin     PRN Meds: acetaminophen, nitroGLYCERIN, ondansetron (ZOFRAN) IV   Vital Signs    Vitals:   09/05/17 2358 09/06/17 0017 09/06/17 0452  BP: 120/79  105/68  Pulse: (!) 58  (!) 47  Resp: 20    Temp: 98.4 F (36.9 C)  97.9 F (36.6 C)  TempSrc: Oral  Oral  SpO2: 96%  96%  Weight: 208 lb 1.6 oz (94.4 kg) 214 lb (97.1 kg)   Height: 5\' 8"  (1.727 m) 5\' 8"  (1.727 m)     Intake/Output Summary (Last 24 hours) at 09/06/2017 0905 Last data filed at 09/06/2017 0700 Gross per 24 hour  Intake 67.7 ml  Output 300 ml  Net -232.3 ml   Filed Weights   09/05/17 2358 09/06/17 0017  Weight: 208 lb 1.6 oz (94.4 kg) 214 lb (97.1 kg)    Telemetry    SB - Personally Reviewed  ECG    SB with TWI in lateral leads - Personally Reviewed  Physical Exam   General: Well developed, well nourished, male appearing in no acute distress. Head: Normocephalic, atraumatic.  Neck: Supple, no JVD. Lungs:  Resp regular and unlabored, CTA. Heart: RRR, S1, S2, no murmur; no rub. Abdomen: Soft, non-tender, non-distended with normoactive bowel sounds.  Extremities: No clubbing, cyanosis, edema. Distal pedal pulses are 2+ bilaterally. Neuro: Alert and oriented X 3. Moves all extremities spontaneously. Psych: Normal affect.  Labs    Chemistry Recent Labs  Lab 08/31/17 0850 09/06/17 0218  NA 141 141  K 4.6 3.8  CL 108 104  CO2 27 27  GLUCOSE 107* 117*  BUN 14 11  CREATININE 0.92 0.99  CALCIUM 9.2 9.6  GFRNONAA >60 >60  GFRAA >60 >60  ANIONGAP 6 10     Hematology Recent Labs  Lab 08/31/17 0850 09/06/17 0218   WBC 6.8 9.4  RBC 4.66 4.45  HGB 15.4 14.3  HCT 45.3 43.1  MCV 97.2 96.9  MCH 33.0 32.1  MCHC 34.0 33.2  RDW 13.3 13.2  PLT 189 192    Cardiac Enzymes Recent Labs  Lab 09/06/17 0218 09/06/17 0718  TROPONINI 0.78* 0.81*   No results for input(s): TROPIPOC in the last 168 hours.   BNPNo results for input(s): BNP, PROBNP in the last 168 hours.   DDimer No results for input(s): DDIMER in the last 168 hours.    Radiology    No results found.  Cardiac Studies   N/a   Patient Profile     67 y.o. male with PMH of HTN, and prostate CA who presented with chest pain and positive troponins.   Assessment & Plan    1. NSTEMI: Symptoms yesterday are concerning for ACS. Trop rising at 0.81 this morning. No recurrent chest pain since admission. Remains on IV heparin. Will plan for cardiac cath today.  -- The patient understands that risks included but are not limited to stroke (1 in 1000), death (1 in 9), kidney failure [usually temporary] (1 in 500), bleeding (1 in 200), allergic reaction [possibly serious] (1 in 200).  -- on ASA,  BB and statin (started this admission) -- echo pending  2. Prostate CA: Reports being followed by Dr. Tresa Moore with Urology with plans initially for removal this Wednesday. Timing for this will need to be reconsidered if PCI/DES needed given the need for antiplatelet therapy.   3. HTN: Reportedly was on coreg 50mg  BID at home prior to admission. Blood pressures soft and HR in the 40-50s. Will reduce back to 3.125mg  BID today.   4. HL: Reports they have been attempting to control with his diet. LDL 108 on admission and high statin was started.   Signed, Reino Bellis, NP  09/06/2017, 9:05 AM  Pager # [phone]   For questions or updates, please contact [place] Please consult [email or web address] for contact info under Cardiology/STEMI.   Patient seen, examined. Available data reviewed. Agree with findings, assessment, and plan as outlined by Reino Bellis, NP-C. On my exam today: Vitals:   09/05/17 2358 09/06/17 0452  BP: 120/79 105/68  Pulse: (!) 58 (!) 47  Resp: 20   Temp: 98.4 F (36.9 C) 97.9 F (36.6 C)  SpO2: 96% 96%   Pt is alert and oriented, NAD HEENT: normal Neck: JVP - normal Lungs: CTA bilaterally CV: RRR without murmur or gallop Abd: soft, NT, Positive BS, no hepatomegaly Ext: no C/C/E, distal pulses intact and equal Skin: warm/dry no rash  Data reviewed. Clear evidence of NSTEMI. No recurrent ischemic sx's since admission. EKG nonacute but suggestive of lateral/anterior ischemia. Plans for cath/PCI reviewed with patient and wife. Consideration of need for prostatectomy which was scheduled later this week. He understands this will need to be delayed significantly depending on intervention necessary to treat his coronary disease.   Sherren Mocha, M.D. 09/06/2017 11:13 AM

## 2017-09-06 NOTE — Interval H&P Note (Signed)
Cath Lab Visit (complete for each Cath Lab visit)  Clinical Evaluation Leading to the Procedure:   ACS: Yes.    Non-ACS:    Anginal Classification: CCS III  Anti-ischemic medical therapy: No Therapy  Non-Invasive Test Results: No non-invasive testing performed  Prior CABG: No previous CABG      History and Physical Interval Note:  09/06/2017 3:42 PM  Brendan Jackson  has presented today for surgery, with the diagnosis of nstemi  The various methods of treatment have been discussed with the patient and family. After consideration of risks, benefits and other options for treatment, the patient has consented to  Procedure(s): LEFT HEART CATH AND CORONARY ANGIOGRAPHY (N/A) as a surgical intervention .  The patient's history has been reviewed, patient examined, no change in status, stable for surgery.  I have reviewed the patient's chart and labs.  Questions were answered to the patient's satisfaction.     Quay Burow

## 2017-09-06 NOTE — Progress Notes (Addendum)
ANTICOAGULATION CONSULT NOTE - Initial Consult  Pharmacy Consult for heparin Indication: chest pain/ACS  No Known Allergies  Patient Measurements: Height: 5\' 8"  (172.7 cm) Weight: 214 lb (97.1 kg) IBW/kg (Calculated) : 68.4 Heparin Dosing Weight: 89kg   Vital Signs: Temp: 98.4 F (36.9 C) (07/07 2358) Temp Source: Oral (07/07 2358) BP: 120/79 (07/07 2358) Pulse Rate: 58 (07/07 2358)  Labs: No results for input(s): HGB, HCT, PLT, APTT, LABPROT, INR, HEPARINUNFRC, HEPRLOWMOCWT, CREATININE, CKTOTAL, CKMB, TROPONINI in the last 72 hours.  Estimated Creatinine Clearance: 90.5 mL/min (by C-G formula based on SCr of 0.92 mg/dL).  Assessment: 67 YOM transferred from [place] with chest pain, troponin elevated at 0.66. He was started on heparin at OSH- given 5000 unit bolus and then started on infusion at 1200 units/hr - currently running now. Hgb 15.7, plts 179; not on anticoagulation PTA and no bleeding noted.  Goal of Therapy:  Heparin level 0.3-0.7 units/ml Monitor platelets by anticoagulation protocol: Yes   Plan:  Continue heparin at 1200 units/hr Daily heparin level and CBC Follow cardiology plans  Blaize Nipper D. Kamrin Spath, PharmD, BCPS Clinical Pharmacist [phone] Please check AMION for all [place] numbers 09/06/2017 12:45 AM   ADDENDUM AM labs drawn early- initial heparin level is therapeutic at 0.41units/mL, but likely only drawn ~4 hours after infusion was started at [facility].  CBC remains WNL.  Plan: Continue heparin at 1200 units/hr Confirmatory heparin level at 0800 to ensure level stays in range at steady state Follow cardiology plans- cath likely  Leviticus Harton D. Yobana Culliton, PharmD, BCPS Clinical Pharmacist [phone] Please check AMION for all [place] numbers 09/06/2017 3:32 AM

## 2017-09-06 NOTE — H&P (View-Only) (Signed)
Progress Note  Patient Name: Brendan Jackson Date of Encounter: 09/06/2017  Primary Cardiologist: No primary care provider on file.  Subjective   Feeling well this morning, no recurrent chest pain.   Inpatient Medications    Scheduled Meds: . aspirin EC  81 mg Oral Daily  . atorvastatin  80 mg Oral q1800  . carvedilol  50 mg Oral BID WC   Continuous Infusions: . heparin     PRN Meds: acetaminophen, nitroGLYCERIN, ondansetron (ZOFRAN) IV   Vital Signs    Vitals:   09/05/17 2358 09/06/17 0017 09/06/17 0452  BP: 120/79  105/68  Pulse: (!) 58  (!) 47  Resp: 20    Temp: 98.4 F (36.9 C)  97.9 F (36.6 C)  TempSrc: Oral  Oral  SpO2: 96%  96%  Weight: 208 lb 1.6 oz (94.4 kg) 214 lb (97.1 kg)   Height: 5\' 8"  (1.727 m) 5\' 8"  (1.727 m)     Intake/Output Summary (Last 24 hours) at 09/06/2017 0905 Last data filed at 09/06/2017 0700 Gross per 24 hour  Intake 67.7 ml  Output 300 ml  Net -232.3 ml   Filed Weights   09/05/17 2358 09/06/17 0017  Weight: 208 lb 1.6 oz (94.4 kg) 214 lb (97.1 kg)    Telemetry    SB - Personally Reviewed  ECG    SB with TWI in lateral leads - Personally Reviewed  Physical Exam   General: Well developed, well nourished, male appearing in no acute distress. Head: Normocephalic, atraumatic.  Neck: Supple, no JVD. Lungs:  Resp regular and unlabored, CTA. Heart: RRR, S1, S2, no murmur; no rub. Abdomen: Soft, non-tender, non-distended with normoactive bowel sounds.  Extremities: No clubbing, cyanosis, edema. Distal pedal pulses are 2+ bilaterally. Neuro: Alert and oriented X 3. Moves all extremities spontaneously. Psych: Normal affect.  Labs    Chemistry Recent Labs  Lab 08/31/17 0850 09/06/17 0218  NA 141 141  K 4.6 3.8  CL 108 104  CO2 27 27  GLUCOSE 107* 117*  BUN 14 11  CREATININE 0.92 0.99  CALCIUM 9.2 9.6  GFRNONAA >60 >60  GFRAA >60 >60  ANIONGAP 6 10     Hematology Recent Labs  Lab 08/31/17 0850 09/06/17 0218   WBC 6.8 9.4  RBC 4.66 4.45  HGB 15.4 14.3  HCT 45.3 43.1  MCV 97.2 96.9  MCH 33.0 32.1  MCHC 34.0 33.2  RDW 13.3 13.2  PLT 189 192    Cardiac Enzymes Recent Labs  Lab 09/06/17 0218 09/06/17 0718  TROPONINI 0.78* 0.81*   No results for input(s): TROPIPOC in the last 168 hours.   BNPNo results for input(s): BNP, PROBNP in the last 168 hours.   DDimer No results for input(s): DDIMER in the last 168 hours.    Radiology    No results found.  Cardiac Studies   N/a   Patient Profile     67 y.o. male with PMH of HTN, and prostate CA who presented with chest pain and positive troponins.   Assessment & Plan    1. NSTEMI: Symptoms yesterday are concerning for ACS. Trop rising at 0.81 this morning. No recurrent chest pain since admission. Remains on IV heparin. Will plan for cardiac cath today.  -- The patient understands that risks included but are not limited to stroke (1 in 1000), death (1 in 45), kidney failure [usually temporary] (1 in 500), bleeding (1 in 200), allergic reaction [possibly serious] (1 in 200).  -- on ASA,  BB and statin (started this admission) -- echo pending  2. Prostate CA: Reports being followed by Dr. Tresa Moore with Urology with plans initially for removal this Wednesday. Timing for this will need to be reconsidered if PCI/DES needed given the need for antiplatelet therapy.   3. HTN: Reportedly was on coreg 50mg  BID at home prior to admission. Blood pressures soft and HR in the 40-50s. Will reduce back to 3.125mg  BID today.   4. HL: Reports they have been attempting to control with his diet. LDL 108 on admission and high statin was started.   Signed, Reino Bellis, NP  09/06/2017, 9:05 AM  Pager # [phone]   For questions or updates, please contact [place] Please consult [email or web address] for contact info under Cardiology/STEMI.   Patient seen, examined. Available data reviewed. Agree with findings, assessment, and plan as outlined by Reino Bellis, NP-C. On my exam today: Vitals:   09/05/17 2358 09/06/17 0452  BP: 120/79 105/68  Pulse: (!) 58 (!) 47  Resp: 20   Temp: 98.4 F (36.9 C) 97.9 F (36.6 C)  SpO2: 96% 96%   Pt is alert and oriented, NAD HEENT: normal Neck: JVP - normal Lungs: CTA bilaterally CV: RRR without murmur or gallop Abd: soft, NT, Positive BS, no hepatomegaly Ext: no C/C/E, distal pulses intact and equal Skin: warm/dry no rash  Data reviewed. Clear evidence of NSTEMI. No recurrent ischemic sx's since admission. EKG nonacute but suggestive of lateral/anterior ischemia. Plans for cath/PCI reviewed with patient and wife. Consideration of need for prostatectomy which was scheduled later this week. He understands this will need to be delayed significantly depending on intervention necessary to treat his coronary disease.   Sherren Mocha, M.D. 09/06/2017 11:13 AM

## 2017-09-06 NOTE — Consult Note (Signed)
[address]       [place] [address]             [phone]        Idrissa Toothman [place] Medical Record #[id number] Date of Birth: Dec 05, 1950  Referring: Dr. Quay Burow  primary Care: Algis Greenhouse, MD Primary Cardiologist:No primary care provider on file.  Chief Complaint:   Indigestion  History of Present Illness:     Patient examined, coronary angiogram images personally reviewed and counseled with patient and wife.  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  He underwent cardiac catheterization today which demonstrates severe three-vessel coronary artery disease with preserved LV function.  LVEDP is normal. Patient is scheduled to have robotic prostatectomy for biopsy-proven adenocarcinoma prostate later this week.  No urinary symptoms other than urgency. Patient has positive family history of CAD-one younger brother suddenly died of a massive MI.  Patient has history of hypertension but denies diabetes, smoking, hyperlipidemia  Current Activity/ Functional Status: Normal functional level, lives with wife   Zubrod Score: At the time of surgery this patient's most appropriate activity status/level should be described as: []     0    Normal activity, no symptoms [x]     1    Restricted in physical strenuous activity but ambulatory, able to do out light work []     2    Ambulatory and capable of self care, unable to do work activities, up and about                 more than 50%  Of the time                            []     3    Only limited self care, in bed greater than 50% of waking hours []     4    Completely disabled, no self care, confined to bed or chair []     5    Moribund  Past Medical History:  Diagnosis Date  . Hypertension   . Prostate cancer [facility])     Past Surgical History:    Procedure Laterality Date  . CATARACT EXTRACTION  2015  . PROSTATE BIOPSY  05/13/2017  . TENDON REPAIR Right 1987   Thumb  . TONSILLECTOMY      Social History   Tobacco Use  Smoking Status Never Smoker  Smokeless Tobacco Former Systems developer  . Types: Chew    Social History   Substance and Sexual Activity  Alcohol Use Yes  . Alcohol/week: 0.6 oz  . Types: 1 Cans of beer per week  . Frequency: Never   Comment: occas     No Known Allergies  Current Facility-Administered Medications  Medication Dose Route Frequency Provider Last Rate Last Dose  . 0.9 %  sodium chloride infusion   Intravenous Continuous Lorretta Harp, MD 75 mL/hr at 09/06/17 1751    . 0.9 %  sodium chloride infusion  250 mL Intravenous PRN Lorretta Harp, MD      . acetaminophen (TYLENOL) tablet 650 mg  650 mg Oral Q4H PRN Lorretta Harp, MD      . aspirin EC tablet 81 mg  81 mg Oral Daily Lorretta Harp, MD   81 mg at 09/06/17 0857  . atorvastatin (  LIPITOR) tablet 80 mg  80 mg Oral q1800 Lorretta Harp, MD   80 mg at 09/06/17 1746  . carvedilol (COREG) tablet 3.125 mg  3.125 mg Oral BID WC Lorretta Harp, MD   3.125 mg at 09/06/17 1743  . heparin ADULT infusion 100 units/mL (25000 units/243mL sodium chloride 0.45%)  1,200 Units/hr Intravenous Continuous Carney, Gay Filler, RPH      . morphine 2 MG/ML injection 2 mg  2 mg Intravenous Q1H PRN Lorretta Harp, MD      . nitroGLYCERIN (NITROSTAT) SL tablet 0.4 mg  0.4 mg Sublingual Q5 Min x 3 PRN Lorretta Harp, MD      . ondansetron [facility]) injection 4 mg  4 mg Intravenous Q6H PRN Lorretta Harp, MD      . sodium chloride flush (NS) 0.9 % injection 3 mL  3 mL Intravenous Q12H Lorretta Harp, MD      . sodium chloride flush (NS) 0.9 % injection 3 mL  3 mL Intravenous PRN Lorretta Harp, MD        Medications Prior to Admission  Medication Sig Dispense Refill Last Dose  . aspirin EC 81 MG tablet Take 81 mg by mouth daily.   09/01/2017 at  Unknown time  . carvedilol (COREG) 25 MG tablet Take 50 mg by mouth 2 (two) times daily with a meal.   09/05/2017 at 1700  . ibuprofen (ADVIL,MOTRIN) 200 MG tablet Take 400 mg by mouth every 6 (six) hours as needed for mild pain.   Past Month at Unknown time    Family History  Problem Relation Age of Onset  . Cancer Paternal Aunt        breast  . Cancer Paternal Uncle        prostate and brain  . Cancer Paternal Uncle        head and neck ca with lung involvement  . Cancer Paternal Aunt        breast  . Cancer Father        pancreatic     Review of Systems:   ROS      Cardiac Review of Systems: Y or  [    ]= no  Chest Pain [    ]  Resting SOB [   ] Exertional SOB  [  ]  Orthopnea [  ]   Pedal Edema [   ]    Palpitations [  ] Syncope  [  ]   Presyncope [   ]  General Review of Systems: [Y] = yes [  ]=no Constitional: recent weight change [  ]; anorexia [  ]; fatigue [  ]; nausea Blue.Reese  ]; night sweats [  ]; fever [  ]; or chills [  ]                                                               Dental: Last Dentist visit: One year  Eye : blurred vision [  ]; diplopia [   ]; vision changes [  ];  Amaurosis fugax[  ]; Resp: cough [  ];  wheezing[  ];  hemoptysis[  ]; shortness of breath[  ]; paroxysmal nocturnal dyspnea[  ]; dyspnea on exertion[  ]; or orthopnea[  ];  GI:  gallstones[  ], vomiting[  ];  dysphagia[  ]; melena[  ];  hematochezia [  ]; heartburn[ y ];   Hx of  Colonoscopy[  ]; GU: kidney stones [  ]; hematuria[  ];   dysuria [  ];  nocturia[  ];  history of     obstruction [  ]; urinary frequency [  ] biopsy-proven prostate cancer             Skin: rash, swelling[  ];, hair loss[  ];  peripheral edema[  ];  or itching[  ]; Musculosketetal: myalgias[  ];  joint swelling[  ];  joint erythema[  ];  joint pain[  ];  back pain[  ];  Heme/Lymph: bruising[  ];  bleeding[  ];  anemia[  ];  Neuro: TIA[  ];  headaches[  ];  stroke[  ];  vertigo[  ];  seizures[  ];   paresthesias[   ];  difficulty walking[  ];  Psych:depression[  ]; anxiety[  ];  Endocrine: diabetes[  ];  thyroid dysfunction[  ];            Physical Exam: BP 128/86   Pulse (!) 53   Temp 98.7 F (37.1 C) (Oral)   Resp 20   Ht 5\' 8"  (1.727 m)   Wt 214 lb (97.1 kg)   SpO2 98%   BMI 32.54 kg/m         Exam    General- alert and comfortable    Neck- no JVD, no cervical adenopathy palpable, no carotid bruit   Lungs- clear without rales, wheezes   Cor- regular rate and rhythm, no murmur , gallop   Abdomen- soft, non-tender   Extremities - warm, non-tender, minimal edema   Neuro- oriented, appropriate, no focal weakness   Diagnostic Studies & Laboratory data:     Recent Radiology Findings:   No results found.   I have independently reviewed the above radiologic studies and discussed with the patient   Recent Lab Findings: Lab Results  Component Value Date   WBC 9.4 09/06/2017   HGB 14.3 09/06/2017   HCT 43.1 09/06/2017   PLT 192 09/06/2017   GLUCOSE 117 (H) 09/06/2017   CHOL 157 09/06/2017   TRIG 61 09/06/2017   HDL 37 (L) 09/06/2017   LDLCALC 108 (H) 09/06/2017   NA 141 09/06/2017   K 3.8 09/06/2017   CL 104 09/06/2017   CREATININE 0.99 09/06/2017   BUN 11 09/06/2017   CO2 27 09/06/2017   TSH 1.982 09/06/2017   HGBA1C 5.5 09/06/2017      Assessment / Plan:   Unstable angina, non-STEMI Severe three-vessel coronary disease with preserved LV systolic function Patient currently stable on IV heparin Echocardiogram and pre-CABG Dopplers are pending  Patient would benefit from multivessel CABG and is scheduled for surgery on first available operating room Thursday, July 11.  I have discussed the procedure of CABG in detail with patient and wife and they understand and agree to proceed.     @ME1 @ 09/06/2017 7:22 PM

## 2017-09-06 NOTE — Progress Notes (Signed)
ANTICOAGULATION CONSULT NOTE - Initial Consult  Pharmacy Consult for heparin Indication: chest pain/ACS  No Known Allergies  Patient Measurements: Height: 5\' 8"  (172.7 cm) Weight: 214 lb (97.1 kg) IBW/kg (Calculated) : 68.4 Heparin Dosing Weight: 89kg   Vital Signs: Temp: 97.9 F (36.6 C) (07/08 0452) Temp Source: Oral (07/08 0452) BP: 105/68 (07/08 0452) Pulse Rate: 47 (07/08 0452)  Labs: Recent Labs    09/06/17 0218 09/06/17 0718  HGB 14.3  --   HCT 43.1  --   PLT 192  --   HEPARINUNFRC 0.41 0.41  CREATININE 0.99  --   TROPONINI 0.78* 0.81*    Estimated Creatinine Clearance: 84.1 mL/min (by C-G formula based on SCr of 0.99 mg/dL).  Assessment: 67 YOM transferred from [place] with chest pain, troponin elevated at 0.66. He was started on heparin at OSH- given 5000 unit bolus and then started on infusion at 1200 units/hr - currently running now.  Heparin level therapeutic this AM  Goal of Therapy:  Heparin level 0.3-0.7 units/ml Monitor platelets by anticoagulation protocol: Yes   Plan:  Continue heparin at 1200 units/hr Daily heparin level and CBC Follow cardiology plans  Thank you Anette Guarneri, PharmD [phone] 09/06/2017 8:46 AM

## 2017-09-07 ENCOUNTER — Inpatient Hospital Stay (HOSPITAL_COMMUNITY): Payer: 59

## 2017-09-07 ENCOUNTER — Ambulatory Visit (HOSPITAL_COMMUNITY): Payer: 59

## 2017-09-07 ENCOUNTER — Encounter (HOSPITAL_COMMUNITY): Payer: Self-pay | Admitting: Cardiovascular Disease

## 2017-09-07 DIAGNOSIS — I340 Nonrheumatic mitral (valve) insufficiency: Secondary | ICD-10-CM

## 2017-09-07 DIAGNOSIS — Z01810 Encounter for preprocedural cardiovascular examination: Secondary | ICD-10-CM

## 2017-09-07 DIAGNOSIS — I351 Nonrheumatic aortic (valve) insufficiency: Secondary | ICD-10-CM

## 2017-09-07 LAB — PULMONARY FUNCTION TEST
DL/VA % pred: 123 %
DL/VA: 5.55 ml/min/mmHg/L
DLCO cor % pred: 101 %
DLCO cor: 30 ml/min/mmHg
DLCO unc % pred: 98 %
DLCO unc: 29.3 ml/min/mmHg
FEF 25-75 Post: 2.65 L/sec
FEF 25-75 Pre: 2.73 L/sec
FEF2575-%Change-Post: -3 %
FEF2575-%Pred-Post: 104 %
FEF2575-%Pred-Pre: 107 %
FEV1-%Change-Post: -2 %
FEV1-%Pred-Post: 83 %
FEV1-%Pred-Pre: 85 %
FEV1-Post: 2.64 L
FEV1-Pre: 2.71 L
FEV1FVC-%Change-Post: -3 %
FEV1FVC-%Pred-Pre: 109 %
FEV6-%Change-Post: 0 %
FEV6-%Pred-Post: 82 %
FEV6-%Pred-Pre: 81 %
FEV6-Post: 3.32 L
FEV6-Pre: 3.3 L
FEV6FVC-%Pred-Post: 105 %
FEV6FVC-%Pred-Pre: 105 %
FVC-%Change-Post: 0 %
FVC-%Pred-Post: 77 %
FVC-%Pred-Pre: 77 %
FVC-Post: 3.32 L
FVC-Pre: 3.3 L
Post FEV1/FVC ratio: 79 %
Post FEV6/FVC ratio: 100 %
Pre FEV1/FVC ratio: 82 %
Pre FEV6/FVC Ratio: 100 %
RV % pred: 102 %
RV: 2.3 L
TLC % pred: 86 %
TLC: 5.74 L

## 2017-09-07 LAB — BASIC METABOLIC PANEL
Anion gap: 13 (ref 5–15)
BUN: 16 mg/dL (ref 8–23)
CHLORIDE: 105 mmol/L (ref 98–111)
CO2: 22 mmol/L (ref 22–32)
Calcium: 8.6 mg/dL — ABNORMAL LOW (ref 8.9–10.3)
Creatinine, Ser: 1.12 mg/dL (ref 0.61–1.24)
GFR calc non Af Amer: >60 mL/min (ref >60)
Glucose, Bld: 95 mg/dL (ref 70–99)
Potassium: 3.7 mmol/L (ref 3.5–5.1)
SODIUM: 140 mmol/L (ref 135–145)

## 2017-09-07 LAB — ECHOCARDIOGRAM COMPLETE
Height: 68 in
Weight: 3323.2 oz

## 2017-09-07 LAB — CBC
HCT: 42.2 % (ref 39.0–52.0)
Hemoglobin: 13.8 g/dL (ref 13.0–17.0)
MCH: 32.6 pg (ref 26.0–34.0)
MCHC: 32.7 g/dL (ref 30.0–36.0)
MCV: 99.8 fL (ref 78.0–100.0)
Platelets: 169 10*3/uL (ref 150–400)
RBC: 4.23 MIL/uL (ref 4.22–5.81)
RDW: 13.2 % (ref 11.5–15.5)
WBC: 7.5 10*3/uL (ref 4.0–10.5)

## 2017-09-07 LAB — PROTIME-INR
INR: 1.17
Prothrombin Time: 14.8 seconds (ref 11.4–15.2)

## 2017-09-07 LAB — SURGICAL PCR SCREEN
MRSA, PCR: NEGATIVE
Staphylococcus aureus: POSITIVE — AB

## 2017-09-07 LAB — HEMOGLOBIN A1C
Hgb A1c MFr Bld: 5.4 % (ref 4.8–5.6)
Mean Plasma Glucose: 108.28 mg/dL

## 2017-09-07 LAB — HEPARIN LEVEL (UNFRACTIONATED)
HEPARIN UNFRACTIONATED: 0.23 [IU]/mL — AB (ref 0.30–0.70)
HEPARIN UNFRACTIONATED: 0.47 [IU]/mL (ref 0.30–0.70)

## 2017-09-07 LAB — TSH: TSH: 4.336 u[IU]/mL (ref 0.350–4.500)

## 2017-09-07 MED ORDER — MUPIROCIN 2 % EX OINT
1.0000 "application " | TOPICAL_OINTMENT | Freq: Two times a day (BID) | CUTANEOUS | Status: DC
  Administered 2017-09-07 – 2017-09-08 (×4): 1 via NASAL
  Filled 2017-09-07 (×2): qty 22

## 2017-09-07 MED ORDER — CHLORHEXIDINE GLUCONATE CLOTH 2 % EX PADS: 6.0000 | MEDICATED_PAD | Freq: Every day | CUTANEOUS | Status: DC

## 2017-09-07 MED ORDER — ALBUTEROL SULFATE (2.5 MG/3ML) 0.083% IN NEBU
2.5000 mg | INHALATION_SOLUTION | Freq: Once | RESPIRATORY_TRACT | Status: AC
  Administered 2017-09-07: 2.5 mg via RESPIRATORY_TRACT

## 2017-09-07 NOTE — Progress Notes (Signed)
CARDIAC REHAB PHASE I   Pt and wife educated on importance of IS and walking post-op. Pt demonstrated 1750 on IS. Pt and wife given Heart Surgery booklet along with preparing for OHS, and in-the-tube sheet. Pt and wife verbalize understanding of need for 24h care at time of d/c and are arranging help. 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 Will continue to follow pt throughout his stay.  [phone] Rufina Falco, RN BSN 09/07/2017 2:09 PM

## 2017-09-07 NOTE — Progress Notes (Signed)
ANTICOAGULATION CONSULT NOTE   Pharmacy Consult for Heparin Indication: CAD awaiting CABG  No Known Allergies  Patient Measurements: Height: 5\' 8"  (172.7 cm) Weight: 207 lb 11.2 oz (94.2 kg) IBW/kg (Calculated) : 68.4 Heparin Dosing Weight: 89kg   Vital Signs: Temp: 97.7 F (36.5 C) (07/09 0509) Temp Source: Oral (07/09 0509) BP: 140/78 (07/09 0509) Pulse Rate: 52 (07/09 0509)  Labs: Recent Labs    09/06/17 0218 09/06/17 0718 09/06/17 1212 09/07/17 0049 09/07/17 0616  HGB 14.3  --   --  13.8  --   HCT 43.1  --   --  42.2  --   PLT 192  --   --  169  --   LABPROT  --   --   --  14.8  --   INR  --   --   --  1.17  --   HEPARINUNFRC 0.41 0.41  --  <0.10* 0.23*  CREATININE 0.99  --   --  1.12  --   TROPONINI 0.78* 0.81* 0.77*  --   --     Estimated Creatinine Clearance: 73.2 mL/min (by C-G formula based on SCr of 1.12 mg/dL).  Assessment: 67 YOM transferred from [place] with chest pain, troponin elevated at 0.66. Now s/p cath lab, pt with significant CAD awaiting consult for CABG.  Pharmacy asked to resume heparin 2 hrs after sheath removed.    Heparin level this morning is slightly SUBtherapeutic (HL 0.23, goal of 0.3-0.7). CBC stable - no bleeding noted.   Goal of Therapy:  Heparin level 0.3-0.7 units/ml Monitor platelets by anticoagulation protocol: Yes   Plan:  - Increase Heparin drip to 1400 units/hr (14 ml/hr) - Will continue to monitor for any signs/symptoms of bleeding and will follow up with heparin level in 6 hours   Thank you for allowing pharmacy to be a part of this patient's care.  Alycia Rossetti, PharmD, BCPS Clinical Pharmacist Pager: [phone] 09/07/2017 7:44 AM

## 2017-09-07 NOTE — Progress Notes (Signed)
ANTICOAGULATION CONSULT NOTE   Pharmacy Consult for Heparin Indication: CAD awaiting CABG  No Known Allergies  Patient Measurements: Height: 5\' 8"  (172.7 cm) Weight: 207 lb 11.2 oz (94.2 kg) IBW/kg (Calculated) : 68.4 Heparin Dosing Weight: 89kg   Vital Signs: Temp: 97.8 F (36.6 C) (07/09 1509) Temp Source: Oral (07/09 1509) BP: 160/91 (07/09 1509) Pulse Rate: 57 (07/09 1718)  Labs: Recent Labs    09/06/17 0218 09/06/17 0718 09/06/17 1212 09/07/17 0049 09/07/17 0616 09/07/17 1551  HGB 14.3  --   --  13.8  --   --   HCT 43.1  --   --  42.2  --   --   PLT 192  --   --  169  --   --   LABPROT  --   --   --  14.8  --   --   INR  --   --   --  1.17  --   --   HEPARINUNFRC 0.41 0.41  --  <0.10* 0.23* 0.47  CREATININE 0.99  --   --  1.12  --   --   TROPONINI 0.78* 0.81* 0.77*  --   --   --     Estimated Creatinine Clearance: 73.2 mL/min (by C-G formula based on SCr of 1.12 mg/dL).  Assessment: 67 YOM transferred from [place] with chest pain, troponin elevated at 0.66. Now s/p cath lab, pt with significant CAD awaiting consult for CABG.  Pharmacy asked to resume heparin 2 hrs after sheath removed.    Heparin level this afternoon is therapeutic (HL 0.47, goal of 0.3-0.7). CBC stable - no bleeding noted.   Goal of Therapy:  Heparin level 0.3-0.7 units/ml Monitor platelets by anticoagulation protocol: Yes   Plan:  - Continue Heparin drip at 1400 units/hr (14 ml/hr) - Will continue to monitor for any signs/symptoms of bleeding and will follow up with heparin level in am  Thank you for allowing pharmacy to be a part of this patient's care.  Erin Hearing PharmD., BCPS Clinical Pharmacist 09/07/2017 5:40 PM

## 2017-09-07 NOTE — Progress Notes (Addendum)
Vascular prelim   Right Carotid:Velocities in the right ICA are consistent with a 1-39% stenosis.  Left Carotid: Velocities in the left ICA are consistent with a 1-39% stenosis.   Right ABI: Pedal artery waveforms within normal limits.  Left ABI: Pedal artery waveforms within normal limits.    Right Upper Extremity: Doppler waveforms decrease >50% with right radial compression. Doppler waveforms remain within normal limits with right ulnar compression.  Left Upper Extremity: Doppler waveforms remain within normal limits with left radial compression. Doppler waveforms remain within normal limits with left ulnar compression.   Landry Mellow, RDMS, RVT

## 2017-09-07 NOTE — Progress Notes (Addendum)
Progress Note  Patient Name: Brendan Jackson Date of Encounter: 09/07/2017  Primary Cardiologist: Burt Knack  Subjective   Feeling well this morning. No chest pain.   Inpatient Medications    Scheduled Meds: . aspirin EC  81 mg Oral Daily  . atorvastatin  80 mg Oral q1800  . carvedilol  3.125 mg Oral BID WC  . Chlorhexidine Gluconate Cloth  6 each Topical Daily  . mupirocin ointment  1 application Nasal BID  . sodium chloride flush  3 mL Intravenous Q12H   Continuous Infusions: . sodium chloride    . heparin 1,400 Units/hr (09/07/17 0814)   PRN Meds: sodium chloride, acetaminophen, morphine injection, nitroGLYCERIN, ondansetron (ZOFRAN) IV, sodium chloride flush   Vital Signs    Vitals:   09/06/17 1743 09/06/17 2113 09/07/17 0509 09/07/17 0812  BP: 128/86 128/86 140/78 (!) 146/80  Pulse: (!) 53 (!) 55 (!) 52 (!) 52  Resp:      Temp:  97.6 F (36.4 C) 97.7 F (36.5 C)   TempSrc:  Oral Oral   SpO2:  98% 97%   Weight:   207 lb 11.2 oz (94.2 kg)   Height:        Intake/Output Summary (Last 24 hours) at 09/07/2017 1019 Last data filed at 09/07/2017 0511 Gross per 24 hour  Intake 46.5 ml  Output 700 ml  Net -653.5 ml   Filed Weights   09/05/17 2358 09/06/17 0017 09/07/17 0509  Weight: 208 lb 1.6 oz (94.4 kg) 214 lb (97.1 kg) 207 lb 11.2 oz (94.2 kg)    Telemetry    SB - Personally Reviewed  Physical Exam   General: Well developed, well nourished, older W male appearing in no acute distress. Head: Normocephalic, atraumatic.  Neck: Supple, JVD. Lungs:  Resp regular and unlabored, CTA. Heart: RRR, S1, S2, no S3, S4, or murmur; no rub. Abdomen: Soft, non-tender, non-distended with normoactive bowel sounds.  Extremities: No clubbing, cyanosis, edema. Distal pedal pulses are 2+ bilaterally. Right radial cath site stable.  Neuro: Alert and oriented X 3. Moves all extremities spontaneously. Psych: Normal affect.  Labs    Chemistry Recent Labs  Lab  09/06/17 0218 09/07/17 0049  NA 141 140  K 3.8 3.7  CL 104 105  CO2 27 22  GLUCOSE 117* 95  BUN 11 16  CREATININE 0.99 1.12  CALCIUM 9.6 8.6*  GFRNONAA >60 >60  GFRAA >60 >60  ANIONGAP 10 13     Hematology Recent Labs  Lab 09/06/17 0218 09/07/17 0049  WBC 9.4 7.5  RBC 4.45 4.23  HGB 14.3 13.8  HCT 43.1 42.2  MCV 96.9 99.8  MCH 32.1 32.6  MCHC 33.2 32.7  RDW 13.2 13.2  PLT 192 169    Cardiac Enzymes Recent Labs  Lab 09/06/17 0218 09/06/17 0718 09/06/17 1212  TROPONINI 0.78* 0.81* 0.77*   No results for input(s): TROPIPOC in the last 168 hours.   BNPNo results for input(s): BNP, PROBNP in the last 168 hours.   DDimer No results for input(s): DDIMER in the last 168 hours.    Radiology    Dg Chest 2 View  Result Date: 09/07/2017 CLINICAL DATA:  Preoperative respiratory evaluation prior to CABG which is scheduled on 09/09/2017. Chest pain. EXAM: CHEST - 2 VIEW COMPARISON:  None. FINDINGS: Cardiac silhouette mildly enlarged. Thoracic aorta mildly atherosclerotic. Hilar and mediastinal contours otherwise unremarkable. Lungs clear. Bronchovascular markings normal. Pulmonary vascularity normal. No visible pleural effusions. No pneumothorax. Degenerative changes involving the LOWER thoracic  spine. IMPRESSION: Mild cardiomegaly.  No acute cardiopulmonary disease. Electronically Signed   By: Evangeline Dakin M.D.   On: 09/07/2017 08:50    Cardiac Studies   Cath: 09/06/17  Conclusion     Ost LAD to Prox LAD lesion is 95% stenosed.  Ost 1st Mrg lesion is 70% stenosed.  Ost Ramus lesion is 95% stenosed.  Mid RCA lesion is 50% stenosed.  Dist RCA lesion is 95% stenosed.  There is mild to moderate left ventricular systolic dysfunction.  LV end diastolic pressure is normal.  The left ventricular ejection fraction is 45-50% by visual estimate.   IMPRESSION: Mr. Hyson has a long high-grade segmental proximal LAD stenosis, ostial/proximal ramus branch  stenosis, obtuse marginal branch stenosis and distal dominant RCA stenosis.  He has an anterolateral wall motion abnormality as a result of his proximal LAD and "non-STEMI".  His anatomy is suitable for coronary artery bypass grafting.  The sheath was removed and a TR band was placed on the right wrist to achieve patent hemostasis.  The patient left the lab in stable condition.  Heparin will be restarted in 2 hours without a bolus.  TCTS has been notified.  Quay Burow. MD, [facility]  Patient Profile     67 y.o. male with PMH of HTN, and prostate CA who presented with chest pain and found to have elevated troponins. Underwent cardiac cath noted above with multivessel disease. Seen by TCTS with plans for CABG on 7/11.   Assessment & Plan    1. NSTEMI: Trop peaked at 0.81. No recurrent chest pain since admission. Underwent cath noted above with multivessel disease. Seen by Dr. Prescott Gum and planned for CABG on 7/11. Remains on IV heparin.  -- on ASA, BB and statin (started this admission) -- echo pending  2. Prostate CA: Reports being followed by Dr. Tresa Moore with Urology with plans initially for removal this week. Timing for this will need to be reconsidered given the need for CABG.   3. HTN: Reportedly was on coreg 50mg  BID at home prior to admission. Blood pressures soft and HR in the 40-50s. Dose was reduced back to 3.125mg  BID.   4. HL: Reports they have been attempting to control with his diet. LDL 108 on admission and high statin was started.    Signed, Reino Bellis, NP  09/07/2017, 10:19 AM  Pager # [phone]   For questions or updates, please contact [place] Please consult [email or web address] for contact info under Cardiology/STEMI.  I have personally seen and examined this patient. I agree with the assessment and plan as outlined above.  He was admitted with a NSTEMI and found to have three vessel CAD by cath yesterday. He is having no chest pain this am. He has been seen by Dr.  Prescott Gum and CABG planned for Thursday 09/09/17. Continue ASA, statin and beta blocker.   Lauree Chandler 09/07/2017 10:32 AM

## 2017-09-08 ENCOUNTER — Ambulatory Visit (HOSPITAL_COMMUNITY): Admission: RE | Admit: 2017-09-08 | Payer: 59 | Source: Ambulatory Visit | Admitting: Urology

## 2017-09-08 ENCOUNTER — Encounter (HOSPITAL_COMMUNITY): Admission: RE | Payer: Self-pay | Source: Ambulatory Visit

## 2017-09-08 LAB — PREPARE RBC (CROSSMATCH)

## 2017-09-08 LAB — CBC
HEMATOCRIT: 43.6 % (ref 39.0–52.0)
Hemoglobin: 14.6 g/dL (ref 13.0–17.0)
MCH: 33 pg (ref 26.0–34.0)
MCHC: 33.5 g/dL (ref 30.0–36.0)
MCV: 98.4 fL (ref 78.0–100.0)
Platelets: 170 10*3/uL (ref 150–400)
RBC: 4.43 MIL/uL (ref 4.22–5.81)
RDW: 12.9 % (ref 11.5–15.5)
WBC: 8.3 10*3/uL (ref 4.0–10.5)

## 2017-09-08 LAB — TYPE AND SCREEN
ABO/RH(D): AB POS
Antibody Screen: NEGATIVE

## 2017-09-08 LAB — ABO/RH: ABO/RH(D): AB POS

## 2017-09-08 LAB — HEPARIN LEVEL (UNFRACTIONATED): Heparin Unfractionated: 0.52 IU/mL (ref 0.30–0.70)

## 2017-09-08 SURGERY — PROSTATECTOMY, RADICAL, ROBOT-ASSISTED, LAPAROSCOPIC
Anesthesia: General

## 2017-09-08 MED ORDER — CHLORHEXIDINE GLUCONATE 4 % EX LIQD
60.0000 mL | Freq: Once | CUTANEOUS | Status: AC
  Administered 2017-09-09: 4 via TOPICAL
  Filled 2017-09-08: qty 60

## 2017-09-08 MED ORDER — NITROGLYCERIN IN D5W 200-5 MCG/ML-% IV SOLN
2.0000 ug/min | INTRAVENOUS | Status: AC
  Administered 2017-09-09: 16.6 ug/min via INTRAVENOUS
  Filled 2017-09-08: qty 250

## 2017-09-08 MED ORDER — SODIUM CHLORIDE 0.9 % IV SOLN
1.5000 g | INTRAVENOUS | Status: AC
  Administered 2017-09-09: 1.5 g via INTRAVENOUS
  Filled 2017-09-08: qty 1.5

## 2017-09-08 MED ORDER — SODIUM CHLORIDE 0.9 % IV SOLN
INTRAVENOUS | Status: DC
  Filled 2017-09-08: qty 30

## 2017-09-08 MED ORDER — MAGNESIUM SULFATE 50 % IJ SOLN
40.0000 meq | INTRAMUSCULAR | Status: DC
  Filled 2017-09-08: qty 9.85

## 2017-09-08 MED ORDER — DEXMEDETOMIDINE HCL IN NACL 400 MCG/100ML IV SOLN
0.1000 ug/kg/h | INTRAVENOUS | Status: AC
  Administered 2017-09-09: .3 ug/kg/h via INTRAVENOUS
  Filled 2017-09-08: qty 100

## 2017-09-08 MED ORDER — VANCOMYCIN HCL 10 G IV SOLR
1500.0000 mg | INTRAVENOUS | Status: AC
  Administered 2017-09-09: 1500 mg via INTRAVENOUS
  Filled 2017-09-08: qty 1500

## 2017-09-08 MED ORDER — METOPROLOL TARTRATE 12.5 MG HALF TABLET
12.5000 mg | ORAL_TABLET | Freq: Once | ORAL | Status: DC
  Filled 2017-09-08: qty 1

## 2017-09-08 MED ORDER — TRANEXAMIC ACID (OHS) BOLUS VIA INFUSION
15.0000 mg/kg | INTRAVENOUS | Status: AC
  Administered 2017-09-09: 1398 mg via INTRAVENOUS
  Filled 2017-09-08: qty 1398

## 2017-09-08 MED ORDER — DOPAMINE-DEXTROSE 3.2-5 MG/ML-% IV SOLN
0.0000 ug/kg/min | INTRAVENOUS | Status: DC
  Filled 2017-09-08: qty 250

## 2017-09-08 MED ORDER — SODIUM CHLORIDE 0.9 % IV SOLN
INTRAVENOUS | Status: AC
  Administered 2017-09-09: .8 [IU]/h via INTRAVENOUS
  Filled 2017-09-08: qty 1

## 2017-09-08 MED ORDER — ALPRAZOLAM 0.25 MG PO TABS: 0.2500 mg | ORAL_TABLET | ORAL | Status: DC | PRN

## 2017-09-08 MED ORDER — CHLORHEXIDINE GLUCONATE 4 % EX LIQD
60.0000 mL | Freq: Once | CUTANEOUS | Status: AC
  Administered 2017-09-08: 4 via TOPICAL
  Filled 2017-09-08: qty 15

## 2017-09-08 MED ORDER — TRANEXAMIC ACID (OHS) PUMP PRIME SOLUTION
2.0000 mg/kg | INTRAVENOUS | Status: DC
Start: 2017-09-09 — End: 2017-09-09
  Filled 2017-09-08: qty 1.86

## 2017-09-08 MED ORDER — BISACODYL 5 MG PO TBEC
5.0000 mg | DELAYED_RELEASE_TABLET | Freq: Once | ORAL | Status: AC
  Administered 2017-09-08: 5 mg via ORAL
  Filled 2017-09-08: qty 1

## 2017-09-08 MED ORDER — POTASSIUM CHLORIDE 2 MEQ/ML IV SOLN
80.0000 meq | INTRAVENOUS | Status: DC
  Filled 2017-09-08: qty 40

## 2017-09-08 MED ORDER — CHLORHEXIDINE GLUCONATE 0.12 % MT SOLN
15.0000 mL | Freq: Once | OROMUCOSAL | Status: AC
  Administered 2017-09-09: 15 mL via OROMUCOSAL
  Filled 2017-09-08: qty 15

## 2017-09-08 MED ORDER — PLASMA-LYTE 148 IV SOLN
INTRAVENOUS | Status: DC
  Filled 2017-09-08: qty 2.5

## 2017-09-08 MED ORDER — SODIUM CHLORIDE 0.9 % IV SOLN
30.0000 ug/min | INTRAVENOUS | Status: DC
  Filled 2017-09-08: qty 2

## 2017-09-08 MED ORDER — SODIUM CHLORIDE 0.9 % IV SOLN
750.0000 mg | INTRAVENOUS | Status: AC
  Administered 2017-09-09: 750 mg via INTRAVENOUS
  Filled 2017-09-08: qty 750

## 2017-09-08 MED ORDER — TEMAZEPAM 15 MG PO CAPS
15.0000 mg | ORAL_CAPSULE | Freq: Once | ORAL | Status: AC | PRN
  Administered 2017-09-08: 15 mg via ORAL
  Filled 2017-09-08: qty 1

## 2017-09-08 MED ORDER — EPINEPHRINE PF 1 MG/ML IJ SOLN
0.0000 ug/min | INTRAMUSCULAR | Status: DC
  Filled 2017-09-08: qty 4

## 2017-09-08 MED ORDER — MILRINONE LACTATE IN DEXTROSE 20-5 MG/100ML-% IV SOLN
0.1250 ug/kg/min | INTRAVENOUS | Status: AC
  Administered 2017-09-09: 0.25 ug/kg/min via INTRAVENOUS
  Filled 2017-09-08: qty 100

## 2017-09-08 MED ORDER — TRANEXAMIC ACID 1000 MG/10ML IV SOLN
1.5000 mg/kg/h | INTRAVENOUS | Status: AC
  Administered 2017-09-09: 1.5 mg/kg/h via INTRAVENOUS
  Filled 2017-09-08: qty 25

## 2017-09-08 NOTE — Plan of Care (Signed)
  Problem: Clinical Measurements: Goal: Ability to maintain clinical measurements within normal limits will improve Outcome: Progressing   

## 2017-09-08 NOTE — Progress Notes (Addendum)
ANTICOAGULATION CONSULT NOTE   Pharmacy Consult for Heparin Indication: CAD awaiting CABG  No Known Allergies  Patient Measurements: Height: 5\' 8"  (172.7 cm) Weight: 205 lb 6.4 oz (93.2 kg) IBW/kg (Calculated) : 68.4 Heparin Dosing Weight: 89kg   Vital Signs: Temp: 98.2 F (36.8 C) (07/10 0614) Temp Source: Oral (07/10 0614) BP: 144/89 (07/10 3559) Pulse Rate: 47 (07/10 0614)  Labs: Recent Labs    09/06/17 0218 09/06/17 7416 09/06/17 1212 09/07/17 0049 09/07/17 0616 09/07/17 1551 09/08/17 0506  HGB 14.3  --   --  13.8  --   --  14.6  HCT 43.1  --   --  42.2  --   --  43.6  PLT 192  --   --  169  --   --  170  LABPROT  --   --   --  14.8  --   --   --   INR  --   --   --  1.17  --   --   --   HEPARINUNFRC 0.41 0.41  --  <0.10* 0.23* 0.47 0.52  CREATININE 0.99  --   --  1.12  --   --   --   TROPONINI 0.78* 0.81* 0.77*  --   --   --   --     Estimated Creatinine Clearance: 72.8 mL/min (by C-G formula based on SCr of 1.12 mg/dL).  Assessment: 67 YOM transferred from [place] with chest pain, troponin elevated at 0.66. Now s/p cath lab, pt with significant CAD scheduled CABG 7/11.    Heparin level this afternoon is therapeutic (HL 0.52, goal of 0.3-0.7). CBC stable - no bleeding noted.   Goal of Therapy:  Heparin level 0.3-0.7 units/ml Monitor platelets by anticoagulation protocol: Yes   Plan:  - Continue Heparin drip at 1400 units/hr (14 ml/hr) - Follow up after CABG 7/11  Thank you for allowing pharmacy to be a part of this patient's care.  Juanell Fairly, PharmD PGY1 Pharmacy Resident Phone [phone] 09/08/2017 9:29 AM   I discussed / reviewed the pharmacy note by Dr. Wilkie Aye and I agree with the resident's findings and plans as documented.  Thank you Anette Guarneri, PharmD [phone]

## 2017-09-08 NOTE — Progress Notes (Signed)
CARDIAC REHAB PHASE I   Offered to walk with pt and wife. Pt states he was walking in hallway x2 yesterday and can walk with wife later today. No questions regarding pre-op education. Pt states many visitors today. Will continue to follow.  [phone] Rufina Falco, RN BSN 09/08/2017 10:46 AM

## 2017-09-08 NOTE — Progress Notes (Addendum)
Progress Note  Patient Name: Brendan Jackson Date of Encounter: 09/08/2017  Primary Cardiologist: No primary care provider on file.  Subjective   No complaints, planned for CABG in the am.   Inpatient Medications    Scheduled Meds: . aspirin EC  81 mg Oral Daily  . atorvastatin  80 mg Oral q1800  . chlorhexidine  60 mL Topical Once   And  . [START ON 09/09/2017] chlorhexidine  60 mL Topical Once  . [START ON 09/09/2017] chlorhexidine  15 mL Mouth/Throat Once  . Chlorhexidine Gluconate Cloth  6 each Topical Daily  . [START ON 09/09/2017] metoprolol tartrate  12.5 mg Oral Once  . mupirocin ointment  1 application Nasal BID  . sodium chloride flush  3 mL Intravenous Q12H   Continuous Infusions: . sodium chloride    . heparin 1,400 Units/hr (09/07/17 1918)   PRN Meds: sodium chloride, acetaminophen, ALPRAZolam, morphine injection, nitroGLYCERIN, ondansetron (ZOFRAN) IV, sodium chloride flush, temazepam   Vital Signs    Vitals:   09/07/17 1509 09/07/17 1718 09/07/17 2052 09/08/17 0614  BP: (!) 160/91  138/85 (!) 144/89  Pulse: 60 (!) 57 (!) 50 (!) 47  Resp:      Temp: 97.8 F (36.6 C)  98.3 F (36.8 C) 98.2 F (36.8 C)  TempSrc: Oral  Oral Oral  SpO2: 99%  98% 98%  Weight:    205 lb 6.4 oz (93.2 kg)  Height:        Intake/Output Summary (Last 24 hours) at 09/08/2017 1042 Last data filed at 09/08/2017 0900 Gross per 24 hour  Intake 845.37 ml  Output -  Net 845.37 ml   Filed Weights   09/06/17 0017 09/07/17 0509 09/08/17 0614  Weight: 214 lb (97.1 kg) 207 lb 11.2 oz (94.2 kg) 205 lb 6.4 oz (93.2 kg)    Telemetry    SB - Personally Reviewed  Physical Exam   General: Well developed, well nourished, male appearing in no acute distress. Head: Normocephalic, atraumatic.  Neck: Supple, JVD. Lungs:  Resp regular and unlabored, CTA. Heart: RRR, S1, S2, no S3, S4, or murmur; no rub. Abdomen: Soft, non-tender, non-distended with normoactive bowel sounds.    Extremities: No clubbing, cyanosis, edema. Distal pedal pulses are 2+ bilaterally. Neuro: Alert and oriented X 3. Moves all extremities spontaneously. Psych: Normal affect.  Labs    Chemistry Recent Labs  Lab 09/06/17 0218 09/07/17 0049  NA 141 140  K 3.8 3.7  CL 104 105  CO2 27 22  GLUCOSE 117* 95  BUN 11 16  CREATININE 0.99 1.12  CALCIUM 9.6 8.6*  GFRNONAA >60 >60  GFRAA >60 >60  ANIONGAP 10 13     Hematology Recent Labs  Lab 09/06/17 0218 09/07/17 0049 09/08/17 0506  WBC 9.4 7.5 8.3  RBC 4.45 4.23 4.43  HGB 14.3 13.8 14.6  HCT 43.1 42.2 43.6  MCV 96.9 99.8 98.4  MCH 32.1 32.6 33.0  MCHC 33.2 32.7 33.5  RDW 13.2 13.2 12.9  PLT 192 169 170    Cardiac Enzymes Recent Labs  Lab 09/06/17 0218 09/06/17 0718 09/06/17 1212  TROPONINI 0.78* 0.81* 0.77*   No results for input(s): TROPIPOC in the last 168 hours.   BNPNo results for input(s): BNP, PROBNP in the last 168 hours.   DDimer No results for input(s): DDIMER in the last 168 hours.    Radiology    Dg Chest 2 View  Result Date: 09/07/2017 CLINICAL DATA:  Preoperative respiratory evaluation prior to CABG  which is scheduled on 09/09/2017. Chest pain. EXAM: CHEST - 2 VIEW COMPARISON:  None. FINDINGS: Cardiac silhouette mildly enlarged. Thoracic aorta mildly atherosclerotic. Hilar and mediastinal contours otherwise unremarkable. Lungs clear. Bronchovascular markings normal. Pulmonary vascularity normal. No visible pleural effusions. No pneumothorax. Degenerative changes involving the LOWER thoracic spine. IMPRESSION: Mild cardiomegaly.  No acute cardiopulmonary disease. Electronically Signed   By: Evangeline Dakin M.D.   On: 09/07/2017 08:50    Cardiac Studies   Cath: 09/06/17  Conclusion     Ost LAD to Prox LAD lesion is 95% stenosed.  Ost 1st Mrg lesion is 70% stenosed.  Ost Ramus lesion is 95% stenosed.  Mid RCA lesion is 50% stenosed.  Dist RCA lesion is 95% stenosed.  There is mild to  moderate left ventricular systolic dysfunction.  LV end diastolic pressure is normal.  The left ventricular ejection fraction is 45-50% by visual estimate.   IMPRESSION:Mr. Cullers has a long high-grade segmental proximal LAD stenosis, ostial/proximal ramus branch stenosis, obtuse marginal branch stenosis and distal dominant RCA stenosis. He has an anterolateral wall motion abnormality as a result of his proximal LAD and "non-STEMI". His anatomy is suitable for coronary artery bypass grafting. The sheath was removed and a TR band was placed on the right wrist to achieve patent hemostasis. The patient left the lab in stable condition. Heparin will be restarted in 2 hours without a bolus. TCTS has been notified.  Quay Burow. MD, [facility]  TTE: 09/07/17  Study Conclusions  - Left ventricle: The cavity size was normal. There was moderate   concentric hypertrophy. Systolic function was mildly reduced. The   estimated ejection fraction was in the range of 45% to 50%. Mild   hypokinesis of the apical anteroseptal myocardium. - Aortic valve: There was mild regurgitation. - Mitral valve: There was mild regurgitation. - Left atrium: The atrium was severely dilated.  Impressions:  - Low normal ejection fraction with mild hypokinesis of   apical-anteroseptum. Mild AR, mild MR  Patient Profile     67 y.o. male with PMH of HTN, and prostate CA who presented with chest pain and found to have elevated troponins. Underwent cardiac cath noted above with multivessel disease. Seen by TCTS with plans for CABG on 7/11.   Assessment & Plan    1. NSTEMI: Trop peaked at 0.81. No recurrent chest pain since admission. Underwent cath noted above with multivessel disease. Seen by Dr. Prescott Gum and planned for CABG in the morning. Remains on IV heparin.  -- on ASA, BB and statin (started this admission) -- echo with low normal EF and mild AR, mild MR.  2. Prostate YB:OFBPZWC being followed by Dr.  [facility] with plans initially for removal this week. Timing for this will need to be reconsidered given the need for CABG.   3. HEN:IDPOEUMPNT was on coreg 50mg  BID at home prior to admission. Blood pressures soft and HR in the 40-50s. Dose was reduced back to 3.125mg  BID. His HR has remained in the 40s-50s, and coreg has been held. Will dc today, consider adding back as appropriate post surgery.   4. IR:WERXVQM they have been attempting to control with his diet. LDL 108 on admission and high statin was started.   Signed, Reino Bellis, NP  09/08/2017, 10:42 AM  Pager # [phone]   For questions or updates, please contact [place] Please consult [email or web address] for contact info under Cardiology/STEMI.  Patient seen, examined. Available data reviewed. Agree with findings, assessment, and plan as  outlined by Reino Bellis, NP-C.  The patient is alert and oriented in no distress.  Lungs are clear, heart is regular rate and rhythm with no murmur gallop, abdomen soft nontender, extremities show no edema.  Right radial site is clear.  Cardiac cath study reviewed and patient with severe multivessel coronary artery disease.  Plans for multivessel CABG tomorrow morning per Dr. Darcey Nora.  The patient has been chest pain-free for the past 24 hours.  He remains on aspirin, high intensity statin drug, a beta-blocker.  Sherren Mocha, M.D. 09/08/2017 1:50 PM

## 2017-09-08 NOTE — Consult Note (Signed)
   [facility] Inpatient Consult   09/08/2017  Baran Kuhrt 1950/12/10 [id number]   Spoke with Mr. [place] and wife at bedside on behalf of [employer] program for [employer] employees/dependents with [facility] insurance. Patient's wife is a Adult nurse.  Discussed that Mr. Paternoster will receive post hospital discharge call when he eventually discharges home.   Provided 24-hr nurse advice line magnet.   Appreciative of visit.   Marthenia Rolling, MSN-Ed, RN,BSN [facility] Liaison [phone]

## 2017-09-08 NOTE — Progress Notes (Signed)
2 Days Post-Op Procedure(s) (LRB): LEFT HEART CATH AND CORONARY ANGIOGRAPHY (N/A) Subjective: Severe three-vessel coronary artery disease with unstable angina Biopsy-proven adenocarcinoma the prostate pending surgery Patient remained stable on IV heparin  Preoperative echocardiogram shows good LV function without valvular disease and preoperative carotid Doppler showed no significant carotid stenosis  I discussed the procedure of CABG in detail with the patient and his wife which is planned for a.m. July 11  Objective: Vital signs in last 24 hours: Temp:  [97.8 F (36.6 C)-98.3 F (36.8 C)] 98.2 F (36.8 C) (07/10 0614) Pulse Rate:  [47-60] 47 (07/10 0614) Cardiac Rhythm: Sinus bradycardia (07/10 0900) BP: (138-160)/(85-91) 144/89 (07/10 0614) SpO2:  [98 %-99 %] 98 % (07/10 0614) Weight:  [205 lb 6.4 oz (93.2 kg)] 205 lb 6.4 oz (93.2 kg) (07/10 4034)  Hemodynamic parameters for last 24 hours:    Intake/Output from previous day: 07/09 0701 - 07/10 0700 In: 845.4 [P.O.:480; I.V.:365.4] Out: -  Intake/Output this shift: Total I/O In: 240 [P.O.:240] Out: -        Exam    General- alert and comfortable    Neck- no JVD, no cervical adenopathy palpable, no carotid bruit   Lungs- clear without rales, wheezes   Cor- regular rate and rhythm, no murmur , gallop   Abdomen- soft, non-tender   Extremities - warm, non-tender, minimal edema   Neuro- oriented, appropriate, no focal weakness   Lab Results: Recent Labs    09/07/17 0049 09/08/17 0506  WBC 7.5 8.3  HGB 13.8 14.6  HCT 42.2 43.6  PLT 169 170   BMET:  Recent Labs    09/06/17 0218 09/07/17 0049  NA 141 140  K 3.8 3.7  CL 104 105  CO2 27 22  GLUCOSE 117* 95  BUN 11 16  CREATININE 0.99 1.12  CALCIUM 9.6 8.6*    PT/INR:  Recent Labs    09/07/17 0049  LABPROT 14.8  INR 1.17   ABG No results found for: PHART, HCO3, TCO2, ACIDBASEDEF, O2SAT CBG (last 3)  No results for input(s): GLUCAP in the last 72  hours.  Assessment/Plan: S/P Procedure(s) (LRB): LEFT HEART CATH AND CORONARY ANGIOGRAPHY (N/A) Proceed with multivessel CABG in a.m. with bypass grafts plan to LAD, ramus, OM, and posterior descending   LOS: 3 days    Tharon Aquas Trigt III 09/08/2017

## 2017-09-08 NOTE — Anesthesia Preprocedure Evaluation (Addendum)
Anesthesia Evaluation  Patient identified by MRN, date of birth, ID band Patient awake    Reviewed: Allergy & Precautions, NPO status , Patient's Chart, lab work & pertinent test results, reviewed documented beta blocker date and time   Airway Mallampati: II  TM Distance: >3 FB Neck ROM: Full    Dental no notable dental hx.    Pulmonary neg pulmonary ROS,    Pulmonary exam normal breath sounds clear to auscultation       Cardiovascular hypertension, Pt. on home beta blockers + angina + CAD  Normal cardiovascular exam Rhythm:Regular Rate:Normal  ECG: SB, rate 53  ECHO: Low normal ejection fraction with mild hypokinesis of apical-anteroseptum. Mild AR, mild MR.  CATH: Ost LAD to Prox LAD lesion is 95% stenosed. Ost 1st Mrg lesion is 70% stenosed. Ost Ramus lesion is 95% stenosed. Mid RCA lesion is 50% stenosed. Dist RCA lesion is 95% stenosed. There is mild to moderate left ventricular systolic dysfunction. LV end diastolic pressure is normal. The left ventricular ejection fraction is 45-50% by visual estimate.   Neuro/Psych negative neurological ROS  negative psych ROS   GI/Hepatic negative GI ROS, Neg liver ROS,   Endo/Other  negative endocrine ROS  Renal/GU negative Renal ROS     Musculoskeletal negative musculoskeletal ROS (+)   Abdominal (+) + obese,   Peds  Hematology negative hematology ROS (+)   Anesthesia Other Findings Prostate CA  Reproductive/Obstetrics                            Anesthesia Physical Anesthesia Plan  ASA: IV  Anesthesia Plan: General   Post-op Pain Management:    Induction: Intravenous  PONV Risk Score and Plan: 2 and Midazolam and Treatment may vary due to age or medical condition  Airway Management Planned: Oral ETT  Additional Equipment: Arterial line, CVP, PA Cath, TEE and Ultrasound Guidance Line Placement  Intra-op Plan:    Post-operative Plan: Post-operative intubation/ventilation  Informed Consent: I have reviewed the patients History and Physical, chart, labs and discussed the procedure including the risks, benefits and alternatives for the proposed anesthesia with the patient or authorized representative who has indicated his/her understanding and acceptance.   Dental advisory given  Plan Discussed with: CRNA  Anesthesia Plan Comments:         Anesthesia Quick Evaluation

## 2017-09-09 ENCOUNTER — Inpatient Hospital Stay (HOSPITAL_COMMUNITY): Payer: 59

## 2017-09-09 ENCOUNTER — Inpatient Hospital Stay (HOSPITAL_COMMUNITY): Payer: 59 | Admitting: Certified Registered Nurse Anesthetist

## 2017-09-09 ENCOUNTER — Inpatient Hospital Stay (HOSPITAL_COMMUNITY)
Admission: AD | Disposition: A | Discharge status: Home Health | Payer: Self-pay | Source: Other Acute Inpatient Hospital | Attending: Cardiothoracic Surgery

## 2017-09-09 DIAGNOSIS — Z951 Presence of aortocoronary bypass graft: Secondary | ICD-10-CM

## 2017-09-09 HISTORY — PX: TEE WITHOUT CARDIOVERSION: SHX5443

## 2017-09-09 HISTORY — PX: CORONARY ARTERY BYPASS GRAFT: SHX141

## 2017-09-09 LAB — POCT I-STAT 3, ART BLOOD GAS (G3+)
ACID-BASE DEFICIT: 2 mmol/L (ref 0.0–2.0)
ACID-BASE DEFICIT: 3 mmol/L — AB (ref 0.0–2.0)
ACID-BASE DEFICIT: 5 mmol/L — AB (ref 0.0–2.0)
ACID-BASE DEFICIT: 5 mmol/L — AB (ref 0.0–2.0)
BICARBONATE: 24.9 mmol/L (ref 20.0–28.0)
BICARBONATE: 20.3 mmol/L (ref 20.0–28.0)
Bicarbonate: 23.9 mmol/L (ref 20.0–28.0)
Bicarbonate: 22.6 mmol/L (ref 20.0–28.0)
Bicarbonate: 20.4 mmol/L (ref 20.0–28.0)
O2 SAT: 100 %
O2 SAT: 98 %
O2 SAT: 96 %
O2 Saturation: 100 %
O2 Saturation: 98 %
PCO2 ART: 38.3 mmHg (ref 32.0–48.0)
PH ART: 7.421 (ref 7.350–7.450)
PO2 ART: 403 mmHg — AB (ref 83.0–108.0)
Patient temperature: 36.1
Patient temperature: 38.1
TCO2: 26 mmol/L (ref 22–32)
TCO2: 25 mmol/L (ref 22–32)
TCO2: 24 mmol/L (ref 22–32)
TCO2: 21 mmol/L — AB (ref 22–32)
TCO2: 21 mmol/L — ABNORMAL LOW (ref 22–32)
pCO2 arterial: 42.2 mmHg (ref 32.0–48.0)
pCO2 arterial: 39.2 mmHg (ref 32.0–48.0)
pCO2 arterial: 39.3 mmHg (ref 32.0–48.0)
pCO2 arterial: 38.3 mmHg (ref 32.0–48.0)
pH, Arterial: 7.362 (ref 7.350–7.450)
pH, Arterial: 7.364 (ref 7.350–7.450)
pH, Arterial: 7.324 — ABNORMAL LOW (ref 7.350–7.450)
pH, Arterial: 7.338 — ABNORMAL LOW (ref 7.350–7.450)
pO2, Arterial: 233 mmHg — ABNORMAL HIGH (ref 83.0–108.0)
pO2, Arterial: 98 mmHg (ref 83.0–108.0)
pO2, Arterial: 106 mmHg (ref 83.0–108.0)
pO2, Arterial: 92 mmHg (ref 83.0–108.0)

## 2017-09-09 LAB — POCT I-STAT 4, (NA,K, GLUC, HGB,HCT)
Glucose, Bld: 118 mg/dL — ABNORMAL HIGH (ref 70–99)
HCT: 37 % — ABNORMAL LOW (ref 39.0–52.0)
Hemoglobin: 12.6 g/dL — ABNORMAL LOW (ref 13.0–17.0)
Potassium: 4.1 mmol/L (ref 3.5–5.1)
Sodium: 140 mmol/L (ref 135–145)

## 2017-09-09 LAB — CBC
HCT: 46.3 % (ref 39.0–52.0)
HCT: 38.4 % — ABNORMAL LOW (ref 39.0–52.0)
HCT: 36.9 % — ABNORMAL LOW (ref 39.0–52.0)
Hemoglobin: 15.6 g/dL (ref 13.0–17.0)
Hemoglobin: 13.1 g/dL (ref 13.0–17.0)
Hemoglobin: 12.4 g/dL — ABNORMAL LOW (ref 13.0–17.0)
MCH: 32.7 pg (ref 26.0–34.0)
MCH: 32.8 pg (ref 26.0–34.0)
MCH: 32.5 pg (ref 26.0–34.0)
MCHC: 33.7 g/dL (ref 30.0–36.0)
MCHC: 34.1 g/dL (ref 30.0–36.0)
MCHC: 33.6 g/dL (ref 30.0–36.0)
MCV: 97.1 fL (ref 78.0–100.0)
MCV: 96 fL (ref 78.0–100.0)
MCV: 96.9 fL (ref 78.0–100.0)
PLATELETS: 177 10*3/uL (ref 150–400)
Platelets: 136 10*3/uL — ABNORMAL LOW (ref 150–400)
Platelets: 145 10*3/uL — ABNORMAL LOW (ref 150–400)
RBC: 4.77 MIL/uL (ref 4.22–5.81)
RBC: 4 MIL/uL — ABNORMAL LOW (ref 4.22–5.81)
RBC: 3.81 MIL/uL — ABNORMAL LOW (ref 4.22–5.81)
RDW: 12.7 % (ref 11.5–15.5)
RDW: 12.5 % (ref 11.5–15.5)
RDW: 12.7 % (ref 11.5–15.5)
WBC: 8.8 10*3/uL (ref 4.0–10.5)
WBC: 15.3 10*3/uL — ABNORMAL HIGH (ref 4.0–10.5)
WBC: 12.5 10*3/uL — ABNORMAL HIGH (ref 4.0–10.5)

## 2017-09-09 LAB — GLUCOSE, CAPILLARY
GLUCOSE-CAPILLARY: 101 mg/dL — AB (ref 70–99)
GLUCOSE-CAPILLARY: 100 mg/dL — AB (ref 70–99)
Glucose-Capillary: 117 mg/dL — ABNORMAL HIGH (ref 70–99)
Glucose-Capillary: 109 mg/dL — ABNORMAL HIGH (ref 70–99)
Glucose-Capillary: 109 mg/dL — ABNORMAL HIGH (ref 70–99)
Glucose-Capillary: 137 mg/dL — ABNORMAL HIGH (ref 70–99)
Glucose-Capillary: 125 mg/dL — ABNORMAL HIGH (ref 70–99)
Glucose-Capillary: 110 mg/dL — ABNORMAL HIGH (ref 70–99)

## 2017-09-09 LAB — POCT I-STAT, CHEM 8
BUN: 12 mg/dL (ref 8–23)
BUN: 10 mg/dL (ref 8–23)
BUN: 11 mg/dL (ref 8–23)
BUN: 10 mg/dL (ref 8–23)
BUN: 10 mg/dL (ref 8–23)
BUN: 12 mg/dL (ref 8–23)
CALCIUM ION: 1.01 mmol/L — AB (ref 1.15–1.40)
CALCIUM ION: 1.12 mmol/L — AB (ref 1.15–1.40)
CALCIUM ION: 1.2 mmol/L (ref 1.15–1.40)
CHLORIDE: 102 mmol/L (ref 98–111)
CHLORIDE: 104 mmol/L (ref 98–111)
CREATININE: 0.6 mg/dL — AB (ref 0.61–1.24)
CREATININE: 0.7 mg/dL (ref 0.61–1.24)
CREATININE: 0.7 mg/dL (ref 0.61–1.24)
CREATININE: 0.7 mg/dL (ref 0.61–1.24)
CREATININE: 0.8 mg/dL (ref 0.61–1.24)
Calcium, Ion: 1.23 mmol/L (ref 1.15–1.40)
Calcium, Ion: 1.2 mmol/L (ref 1.15–1.40)
Calcium, Ion: 1.11 mmol/L — ABNORMAL LOW (ref 1.15–1.40)
Chloride: 103 mmol/L (ref 98–111)
Chloride: 103 mmol/L (ref 98–111)
Chloride: 103 mmol/L (ref 98–111)
Chloride: 104 mmol/L (ref 98–111)
Creatinine, Ser: 0.7 mg/dL (ref 0.61–1.24)
GLUCOSE: 110 mg/dL — AB (ref 70–99)
GLUCOSE: 106 mg/dL — AB (ref 70–99)
Glucose, Bld: 100 mg/dL — ABNORMAL HIGH (ref 70–99)
Glucose, Bld: 103 mg/dL — ABNORMAL HIGH (ref 70–99)
Glucose, Bld: 141 mg/dL — ABNORMAL HIGH (ref 70–99)
Glucose, Bld: 127 mg/dL — ABNORMAL HIGH (ref 70–99)
HCT: 29 % — ABNORMAL LOW (ref 39.0–52.0)
HCT: 38 % — ABNORMAL LOW (ref 39.0–52.0)
HCT: 32 % — ABNORMAL LOW (ref 39.0–52.0)
HCT: 34 % — ABNORMAL LOW (ref 39.0–52.0)
HCT: 37 % — ABNORMAL LOW (ref 39.0–52.0)
HEMATOCRIT: 40 % (ref 39.0–52.0)
HEMOGLOBIN: 13.6 g/dL (ref 13.0–17.0)
HEMOGLOBIN: 12.6 g/dL — AB (ref 13.0–17.0)
Hemoglobin: 12.9 g/dL — ABNORMAL LOW (ref 13.0–17.0)
Hemoglobin: 9.9 g/dL — ABNORMAL LOW (ref 13.0–17.0)
Hemoglobin: 10.9 g/dL — ABNORMAL LOW (ref 13.0–17.0)
Hemoglobin: 11.6 g/dL — ABNORMAL LOW (ref 13.0–17.0)
POTASSIUM: 4 mmol/L (ref 3.5–5.1)
POTASSIUM: 4.5 mmol/L (ref 3.5–5.1)
POTASSIUM: 4.3 mmol/L (ref 3.5–5.1)
Potassium: 4.1 mmol/L (ref 3.5–5.1)
Potassium: 5 mmol/L (ref 3.5–5.1)
Potassium: 4.3 mmol/L (ref 3.5–5.1)
SODIUM: 138 mmol/L (ref 135–145)
Sodium: 141 mmol/L (ref 135–145)
Sodium: 139 mmol/L (ref 135–145)
Sodium: 139 mmol/L (ref 135–145)
Sodium: 138 mmol/L (ref 135–145)
Sodium: 138 mmol/L (ref 135–145)
TCO2: 26 mmol/L (ref 22–32)
TCO2: 26 mmol/L (ref 22–32)
TCO2: 26 mmol/L (ref 22–32)
TCO2: 24 mmol/L (ref 22–32)
TCO2: 23 mmol/L (ref 22–32)
TCO2: 20 mmol/L — AB (ref 22–32)

## 2017-09-09 LAB — HEPARIN LEVEL (UNFRACTIONATED): Heparin Unfractionated: 0.37 IU/mL (ref 0.30–0.70)

## 2017-09-09 LAB — BASIC METABOLIC PANEL
Anion gap: 8 (ref 5–15)
BUN: 12 mg/dL (ref 8–23)
CO2: 27 mmol/L (ref 22–32)
Calcium: 9.2 mg/dL (ref 8.9–10.3)
Chloride: 105 mmol/L (ref 98–111)
Creatinine, Ser: 1.01 mg/dL (ref 0.61–1.24)
GFR calc Af Amer: >60 mL/min (ref >60)
GFR calc non Af Amer: >60 mL/min (ref >60)
Glucose, Bld: 105 mg/dL — ABNORMAL HIGH (ref 70–99)
Potassium: 3.5 mmol/L (ref 3.5–5.1)
Sodium: 140 mmol/L (ref 135–145)

## 2017-09-09 LAB — CREATININE, SERUM
Creatinine, Ser: 0.87 mg/dL (ref 0.61–1.24)
GFR calc Af Amer: >60 mL/min (ref >60)
GFR calc non Af Amer: >60 mL/min (ref >60)

## 2017-09-09 LAB — PLATELET COUNT: Platelets: 132 10*3/uL — ABNORMAL LOW (ref 150–400)

## 2017-09-09 LAB — HEMOGLOBIN AND HEMATOCRIT, BLOOD
HCT: 34.3 % — ABNORMAL LOW (ref 39.0–52.0)
Hemoglobin: 11.7 g/dL — ABNORMAL LOW (ref 13.0–17.0)

## 2017-09-09 LAB — PROTIME-INR
INR: 1.43
PROTHROMBIN TIME: 17.3 s — AB (ref 11.4–15.2)

## 2017-09-09 LAB — APTT: aPTT: 32 seconds (ref 24–36)

## 2017-09-09 LAB — MAGNESIUM: Magnesium: 2.6 mg/dL — ABNORMAL HIGH (ref 1.7–2.4)

## 2017-09-09 SURGERY — CORONARY ARTERY BYPASS GRAFTING (CABG)
Anesthesia: General | Site: Chest

## 2017-09-09 MED ORDER — SODIUM CHLORIDE 0.9% FLUSH
3.0000 mL | Freq: Two times a day (BID) | INTRAVENOUS | Status: DC
  Administered 2017-09-10 (×2): 3 mL via INTRAVENOUS
  Administered 2017-09-11: 9 mL via INTRAVENOUS
  Administered 2017-09-11 – 2017-09-12 (×2): 3 mL via INTRAVENOUS

## 2017-09-09 MED ORDER — PAPAVERINE HCL 30 MG/ML IJ SOLN
INTRAMUSCULAR | Status: DC | PRN
  Administered 2017-09-09: 10:00:00 via INTRAVASCULAR

## 2017-09-09 MED ORDER — METOPROLOL TARTRATE 5 MG/5ML IV SOLN: 2.5000 mg | INTRAVENOUS | Status: DC | PRN

## 2017-09-09 MED ORDER — ASPIRIN EC 325 MG PO TBEC
325.0000 mg | DELAYED_RELEASE_TABLET | Freq: Every day | ORAL | Status: DC
  Administered 2017-09-10 – 2017-09-12 (×3): 325 mg via ORAL
  Filled 2017-09-09 (×3): qty 1

## 2017-09-09 MED ORDER — ROCURONIUM BROMIDE 10 MG/ML (PF) SYRINGE
PREFILLED_SYRINGE | INTRAVENOUS | Status: AC
  Filled 2017-09-09: qty 10

## 2017-09-09 MED ORDER — LACTATED RINGERS IV SOLN
INTRAVENOUS | Status: DC
  Administered 2017-09-10: 02:00:00 via INTRAVENOUS

## 2017-09-09 MED ORDER — FENTANYL CITRATE (PF) 100 MCG/2ML IJ SOLN
INTRAMUSCULAR | Status: DC | PRN
  Administered 2017-09-09: 50 ug via INTRAVENOUS
  Administered 2017-09-09 (×3): 100 ug via INTRAVENOUS
  Administered 2017-09-09: 150 ug via INTRAVENOUS
  Administered 2017-09-09: 100 ug via INTRAVENOUS
  Administered 2017-09-09: 50 ug via INTRAVENOUS
  Administered 2017-09-09: 100 ug via INTRAVENOUS
  Administered 2017-09-09 (×3): 150 ug via INTRAVENOUS
  Administered 2017-09-09: 50 ug via INTRAVENOUS

## 2017-09-09 MED ORDER — ROCURONIUM BROMIDE 100 MG/10ML IV SOLN
INTRAVENOUS | Status: DC | PRN
  Administered 2017-09-09: 40 mg via INTRAVENOUS
  Administered 2017-09-09 (×2): 50 mg via INTRAVENOUS
  Administered 2017-09-09: 60 mg via INTRAVENOUS

## 2017-09-09 MED ORDER — LACTATED RINGERS IV SOLN: 500.0000 mL | Freq: Once | INTRAVENOUS | Status: DC | PRN

## 2017-09-09 MED ORDER — SODIUM CHLORIDE 0.9 % IV SOLN
INTRAVENOUS | Status: DC
  Filled 2017-09-09: qty 1

## 2017-09-09 MED ORDER — BISACODYL 10 MG RE SUPP: 10.0000 mg | Freq: Every day | RECTAL | Status: DC

## 2017-09-09 MED ORDER — ARTIFICIAL TEARS OPHTHALMIC OINT
TOPICAL_OINTMENT | OPHTHALMIC | Status: DC | PRN
  Administered 2017-09-09: 1 via OPHTHALMIC

## 2017-09-09 MED ORDER — DEXMEDETOMIDINE HCL IN NACL 400 MCG/100ML IV SOLN
0.0000 ug/kg/h | INTRAVENOUS | Status: DC
  Administered 2017-09-09: 0.2 ug/kg/h via INTRAVENOUS
  Administered 2017-09-09: 0.7 ug/kg/h via INTRAVENOUS
  Filled 2017-09-09: qty 100

## 2017-09-09 MED ORDER — ACETAMINOPHEN 160 MG/5ML PO SOLN: 1000.0000 mg | Freq: Four times a day (QID) | ORAL | Status: DC

## 2017-09-09 MED ORDER — MORPHINE SULFATE (PF) 2 MG/ML IV SOLN
2.0000 mg | INTRAVENOUS | Status: DC | PRN
  Administered 2017-09-09 – 2017-09-10 (×6): 2 mg via INTRAVENOUS
  Filled 2017-09-09 (×6): qty 1

## 2017-09-09 MED ORDER — METOPROLOL TARTRATE 12.5 MG HALF TABLET
12.5000 mg | ORAL_TABLET | Freq: Two times a day (BID) | ORAL | Status: DC
  Administered 2017-09-11 – 2017-09-12 (×3): 12.5 mg via ORAL
  Filled 2017-09-09 (×4): qty 1

## 2017-09-09 MED ORDER — INSULIN REGULAR BOLUS VIA INFUSION
0.0000 [IU] | Freq: Three times a day (TID) | INTRAVENOUS | Status: DC
  Filled 2017-09-09: qty 10

## 2017-09-09 MED ORDER — HEPARIN SODIUM (PORCINE) 1000 UNIT/ML IJ SOLN
INTRAMUSCULAR | Status: DC | PRN
  Administered 2017-09-09 (×2): 2000 [IU] via INTRAVENOUS
  Administered 2017-09-09: 10000 [IU] via INTRAVENOUS
  Administered 2017-09-09: 26000 [IU] via INTRAVENOUS

## 2017-09-09 MED ORDER — PROTAMINE SULFATE 10 MG/ML IV SOLN
INTRAVENOUS | Status: DC | PRN
  Administered 2017-09-09: 300 mg via INTRAVENOUS

## 2017-09-09 MED ORDER — ONDANSETRON HCL 4 MG/2ML IJ SOLN: 4.0000 mg | Freq: Four times a day (QID) | INTRAMUSCULAR | Status: DC | PRN

## 2017-09-09 MED ORDER — LACTATED RINGERS IV SOLN: INTRAVENOUS | Status: DC

## 2017-09-09 MED ORDER — LACTATED RINGERS IV SOLN
INTRAVENOUS | Status: DC | PRN
  Administered 2017-09-09: 07:00:00 via INTRAVENOUS

## 2017-09-09 MED ORDER — FENTANYL CITRATE (PF) 250 MCG/5ML IJ SOLN
INTRAMUSCULAR | Status: AC
  Filled 2017-09-09: qty 25

## 2017-09-09 MED ORDER — MORPHINE SULFATE (PF) 2 MG/ML IV SOLN: 1.0000 mg | INTRAVENOUS | Status: AC | PRN

## 2017-09-09 MED ORDER — SODIUM CHLORIDE 0.9 % IV SOLN
INTRAVENOUS | Status: DC
  Administered 2017-09-09: 14:00:00 via INTRAVENOUS

## 2017-09-09 MED ORDER — PROTAMINE SULFATE 10 MG/ML IV SOLN
INTRAVENOUS | Status: AC
  Filled 2017-09-09: qty 5

## 2017-09-09 MED ORDER — CALCIUM CHLORIDE 10 % IV SOLN
1.0000 g | Freq: Once | INTRAVENOUS | Status: AC
Start: 2017-09-09 — End: 2017-09-09
  Administered 2017-09-09: 1 g via INTRAVENOUS

## 2017-09-09 MED ORDER — VANCOMYCIN HCL IN DEXTROSE 1-5 GM/200ML-% IV SOLN
1000.0000 mg | Freq: Once | INTRAVENOUS | Status: AC
  Administered 2017-09-09: 1000 mg via INTRAVENOUS
  Filled 2017-09-09: qty 200

## 2017-09-09 MED ORDER — ASPIRIN 81 MG PO CHEW: 324.0000 mg | CHEWABLE_TABLET | Freq: Every day | ORAL | Status: DC

## 2017-09-09 MED ORDER — METOCLOPRAMIDE HCL 5 MG/ML IJ SOLN
10.0000 mg | Freq: Four times a day (QID) | INTRAMUSCULAR | Status: AC
  Administered 2017-09-09 – 2017-09-10 (×4): 10 mg via INTRAVENOUS
  Filled 2017-09-09 (×3): qty 2

## 2017-09-09 MED ORDER — ORAL CARE MOUTH RINSE
15.0000 mL | Freq: Two times a day (BID) | OROMUCOSAL | Status: DC
  Administered 2017-09-10 – 2017-09-14 (×8): 15 mL via OROMUCOSAL

## 2017-09-09 MED ORDER — ACETAMINOPHEN 160 MG/5ML PO SOLN: 650.0000 mg | Freq: Once | ORAL | Status: AC

## 2017-09-09 MED ORDER — 0.9 % SODIUM CHLORIDE (POUR BTL) OPTIME
TOPICAL | Status: DC | PRN
  Administered 2017-09-09: 6000 mL

## 2017-09-09 MED ORDER — MIDAZOLAM HCL 5 MG/5ML IJ SOLN
INTRAMUSCULAR | Status: DC | PRN
Start: 2017-09-09 — End: 2017-09-09
  Administered 2017-09-09 (×4): 2 mg via INTRAVENOUS
  Administered 2017-09-09: 1 mg via INTRAVENOUS

## 2017-09-09 MED ORDER — GLYCOPYRROLATE PF 0.2 MG/ML IJ SOSY
PREFILLED_SYRINGE | INTRAMUSCULAR | Status: AC
  Filled 2017-09-09: qty 1

## 2017-09-09 MED ORDER — ACETAMINOPHEN 500 MG PO TABS
1000.0000 mg | ORAL_TABLET | Freq: Four times a day (QID) | ORAL | Status: DC
  Administered 2017-09-09 – 2017-09-12 (×9): 1000 mg via ORAL
  Filled 2017-09-09 (×9): qty 2

## 2017-09-09 MED ORDER — PROTAMINE SULFATE 10 MG/ML IV SOLN
INTRAVENOUS | Status: AC
  Filled 2017-09-09: qty 25

## 2017-09-09 MED ORDER — SODIUM CHLORIDE 0.9 % IV SOLN
1.5000 g | Freq: Two times a day (BID) | INTRAVENOUS | Status: AC
  Administered 2017-09-09 – 2017-09-11 (×4): 1.5 g via INTRAVENOUS
  Filled 2017-09-09 (×4): qty 1.5

## 2017-09-09 MED ORDER — DEXMEDETOMIDINE HCL IN NACL 200 MCG/50ML IV SOLN
INTRAVENOUS | Status: AC
  Filled 2017-09-09: qty 50

## 2017-09-09 MED ORDER — PHENYLEPHRINE HCL-NACL 20-0.9 MG/250ML-% IV SOLN
0.0000 ug/min | INTRAVENOUS | Status: DC
  Administered 2017-09-09: 10 ug/min via INTRAVENOUS
  Filled 2017-09-09 (×2): qty 250

## 2017-09-09 MED ORDER — DOCUSATE SODIUM 100 MG PO CAPS
200.0000 mg | ORAL_CAPSULE | Freq: Every day | ORAL | Status: DC
  Administered 2017-09-10 – 2017-09-12 (×3): 200 mg via ORAL
  Filled 2017-09-09 (×3): qty 2

## 2017-09-09 MED ORDER — ALBUMIN HUMAN 5 % IV SOLN
250.0000 mL | INTRAVENOUS | Status: AC | PRN
  Administered 2017-09-09 (×4): 250 mL via INTRAVENOUS
  Filled 2017-09-09 (×3): qty 250

## 2017-09-09 MED ORDER — GLYCOPYRROLATE 0.2 MG/ML IJ SOLN
INTRAMUSCULAR | Status: DC | PRN
  Administered 2017-09-09: .1 mg via INTRAVENOUS
  Administered 2017-09-09: 0.1 mg via INTRAVENOUS

## 2017-09-09 MED ORDER — LIDOCAINE 2% (20 MG/ML) 5 ML SYRINGE
INTRAMUSCULAR | Status: AC
  Filled 2017-09-09: qty 5

## 2017-09-09 MED ORDER — SODIUM CHLORIDE 0.45 % IV SOLN
INTRAVENOUS | Status: DC | PRN
  Administered 2017-09-09: 14:00:00 via INTRAVENOUS

## 2017-09-09 MED ORDER — HEPARIN SODIUM (PORCINE) 1000 UNIT/ML IJ SOLN
INTRAMUSCULAR | Status: AC
  Filled 2017-09-09: qty 1

## 2017-09-09 MED ORDER — LACTATED RINGERS IV SOLN
INTRAVENOUS | Status: DC | PRN
Start: 2017-09-09 — End: 2017-09-09
  Administered 2017-09-09: 07:00:00 via INTRAVENOUS

## 2017-09-09 MED ORDER — PROPOFOL 10 MG/ML IV BOLUS
INTRAVENOUS | Status: AC
Start: 2017-09-09 — End: ?
  Filled 2017-09-09: qty 20

## 2017-09-09 MED ORDER — ACETAMINOPHEN 650 MG RE SUPP
650.0000 mg | Freq: Once | RECTAL | Status: AC
  Administered 2017-09-09: 650 mg via RECTAL

## 2017-09-09 MED ORDER — PHENYLEPHRINE 40 MCG/ML (10ML) SYRINGE FOR IV PUSH (FOR BLOOD PRESSURE SUPPORT)
PREFILLED_SYRINGE | INTRAVENOUS | Status: AC
  Filled 2017-09-09: qty 10

## 2017-09-09 MED ORDER — METOPROLOL TARTRATE 25 MG/10 ML ORAL SUSPENSION
12.5000 mg | Freq: Two times a day (BID) | ORAL | Status: DC
  Administered 2017-09-10: 12.5 mg
  Filled 2017-09-09: qty 5

## 2017-09-09 MED ORDER — SODIUM CHLORIDE 0.9% FLUSH: 3.0000 mL | INTRAVENOUS | Status: DC | PRN

## 2017-09-09 MED ORDER — BISACODYL 5 MG PO TBEC
10.0000 mg | DELAYED_RELEASE_TABLET | Freq: Every day | ORAL | Status: DC
  Administered 2017-09-10 – 2017-09-12 (×3): 10 mg via ORAL
  Filled 2017-09-09 (×3): qty 2

## 2017-09-09 MED ORDER — SUCCINYLCHOLINE CHLORIDE 200 MG/10ML IV SOSY
PREFILLED_SYRINGE | INTRAVENOUS | Status: AC
  Filled 2017-09-09: qty 10

## 2017-09-09 MED ORDER — FENTANYL CITRATE (PF) 250 MCG/5ML IJ SOLN
INTRAMUSCULAR | Status: AC
  Filled 2017-09-09: qty 5

## 2017-09-09 MED ORDER — CHLORHEXIDINE GLUCONATE 0.12 % MT SOLN
15.0000 mL | OROMUCOSAL | Status: AC
  Administered 2017-09-09: 15 mL via OROMUCOSAL

## 2017-09-09 MED ORDER — SODIUM CHLORIDE 0.9 % IV SOLN: 250.0000 mL | INTRAVENOUS | Status: DC

## 2017-09-09 MED ORDER — MAGNESIUM SULFATE 4 GM/100ML IV SOLN
4.0000 g | Freq: Once | INTRAVENOUS | Status: AC
  Administered 2017-09-09: 4 g via INTRAVENOUS
  Filled 2017-09-09: qty 100

## 2017-09-09 MED ORDER — FAMOTIDINE IN NACL 20-0.9 MG/50ML-% IV SOLN
20.0000 mg | Freq: Two times a day (BID) | INTRAVENOUS | Status: AC
  Administered 2017-09-09: 20 mg via INTRAVENOUS

## 2017-09-09 MED ORDER — TRAMADOL HCL 50 MG PO TABS: 50.0000 mg | ORAL_TABLET | ORAL | Status: DC | PRN

## 2017-09-09 MED ORDER — NITROGLYCERIN IN D5W 200-5 MCG/ML-% IV SOLN: 0.0000 ug/min | INTRAVENOUS | Status: DC

## 2017-09-09 MED ORDER — CHLORHEXIDINE GLUCONATE CLOTH 2 % EX PADS
6.0000 | MEDICATED_PAD | Freq: Every day | CUTANEOUS | Status: AC
  Administered 2017-09-10 – 2017-09-14 (×5): 6 via TOPICAL

## 2017-09-09 MED ORDER — MIDAZOLAM HCL 2 MG/2ML IJ SOLN: 2.0000 mg | INTRAMUSCULAR | Status: DC | PRN

## 2017-09-09 MED ORDER — PROPOFOL 10 MG/ML IV BOLUS
INTRAVENOUS | Status: DC | PRN
  Administered 2017-09-09 (×3): 50 mg via INTRAVENOUS
  Administered 2017-09-09: 40 mg via INTRAVENOUS
  Administered 2017-09-09: 50 mg via INTRAVENOUS
  Administered 2017-09-09: 20 mg via INTRAVENOUS

## 2017-09-09 MED ORDER — PHENYLEPHRINE HCL 10 MG/ML IJ SOLN
INTRAVENOUS | Status: DC | PRN
  Administered 2017-09-09: 5 ug/min via INTRAVENOUS

## 2017-09-09 MED ORDER — SODIUM CHLORIDE 0.9 % IV SOLN
INTRAVENOUS | Status: DC | PRN
  Administered 2017-09-09: 13:00:00 via INTRAVENOUS

## 2017-09-09 MED ORDER — MIDAZOLAM HCL 10 MG/2ML IJ SOLN
INTRAMUSCULAR | Status: AC
  Filled 2017-09-09: qty 2

## 2017-09-09 MED ORDER — PANTOPRAZOLE SODIUM 40 MG PO TBEC
40.0000 mg | DELAYED_RELEASE_TABLET | Freq: Every day | ORAL | Status: DC
  Administered 2017-09-10 – 2017-09-12 (×3): 40 mg via ORAL
  Filled 2017-09-09 (×3): qty 1

## 2017-09-09 MED ORDER — EPHEDRINE SULFATE 50 MG/ML IJ SOLN
INTRAMUSCULAR | Status: AC
  Filled 2017-09-09: qty 1

## 2017-09-09 MED ORDER — MUPIROCIN 2 % EX OINT
1.0000 "application " | TOPICAL_OINTMENT | Freq: Two times a day (BID) | CUTANEOUS | Status: DC
  Administered 2017-09-10 – 2017-09-14 (×9): 1 via NASAL
  Filled 2017-09-09 (×4): qty 22

## 2017-09-09 MED ORDER — POTASSIUM CHLORIDE 10 MEQ/50ML IV SOLN: 10.0000 meq | INTRAVENOUS | Status: AC

## 2017-09-09 MED ORDER — HEMOSTATIC AGENTS (NO CHARGE) OPTIME
TOPICAL | Status: DC | PRN
  Administered 2017-09-09 (×4): 1 via TOPICAL

## 2017-09-09 MED ORDER — ALBUMIN HUMAN 5 % IV SOLN
INTRAVENOUS | Status: DC | PRN
  Administered 2017-09-09: 13:00:00 via INTRAVENOUS

## 2017-09-09 MED ORDER — OXYCODONE HCL 5 MG PO TABS
5.0000 mg | ORAL_TABLET | ORAL | Status: DC | PRN
  Administered 2017-09-10 – 2017-09-11 (×3): 10 mg via ORAL
  Filled 2017-09-09 (×3): qty 2

## 2017-09-09 MED ORDER — MILRINONE LACTATE IN DEXTROSE 20-5 MG/100ML-% IV SOLN
0.2500 ug/kg/min | INTRAVENOUS | Status: DC
  Administered 2017-09-09: 0.25 ug/kg/min via INTRAVENOUS
  Filled 2017-09-09: qty 100

## 2017-09-09 MED FILL — Sodium Bicarbonate IV Soln 8.4%: INTRAVENOUS | Qty: 50 | Status: AC

## 2017-09-09 MED FILL — Lidocaine HCl(Cardiac) IV PF Soln Pref Syr 100 MG/5ML (2%): INTRAVENOUS | Qty: 10 | Status: AC

## 2017-09-09 MED FILL — Heparin Sodium (Porcine) Inj 1000 Unit/ML: INTRAMUSCULAR | Qty: 30 | Status: AC

## 2017-09-09 MED FILL — Mannitol IV Soln 20%: INTRAVENOUS | Qty: 500 | Status: AC

## 2017-09-09 MED FILL — Sodium Chloride IV Soln 0.9%: INTRAVENOUS | Qty: 2000 | Status: AC

## 2017-09-09 MED FILL — Electrolyte-R (PH 7.4) Solution: INTRAVENOUS | Qty: 4000 | Status: AC

## 2017-09-09 SURGICAL SUPPLY — 83 items
ADAPTER CARDIO PERF ANTE/RETRO (ADAPTER) ×4 IMPLANT
APPLICATOR COTTON TIP 6 STRL (MISCELLANEOUS) ×2 IMPLANT
APPLICATOR COTTON TIP 6IN STRL (MISCELLANEOUS) ×4
BAG DECANTER FOR FLEXI CONT (MISCELLANEOUS) ×4 IMPLANT
BANDAGE ACE 4X5 VEL STRL LF (GAUZE/BANDAGES/DRESSINGS) ×8 IMPLANT
BANDAGE ACE 6X5 VEL STRL LF (GAUZE/BANDAGES/DRESSINGS) ×8 IMPLANT
BASKET HEART  (ORDER IN 25'S) (MISCELLANEOUS) ×1
BASKET HEART (ORDER IN 25'S) (MISCELLANEOUS) ×1
BASKET HEART (ORDER IN 25S) (MISCELLANEOUS) ×2 IMPLANT
BLADE STERNUM SYSTEM 6 (BLADE) ×4 IMPLANT
BLADE SURG 12 STRL SS (BLADE) ×4 IMPLANT
BNDG GAUZE ELAST 4 BULKY (GAUZE/BANDAGES/DRESSINGS) ×8 IMPLANT
CANISTER SUCT 3000ML PPV (MISCELLANEOUS) ×4 IMPLANT
CANNULA GUNDRY RCSP 15FR (MISCELLANEOUS) ×4 IMPLANT
CATH CPB KIT VANTRIGT (MISCELLANEOUS) ×4 IMPLANT
CATH ROBINSON RED A/P 18FR (CATHETERS) ×12 IMPLANT
CATH THORACIC 36FR RT ANG (CATHETERS) ×4 IMPLANT
CLIP FOGARTY SPRING 6M (CLIP) ×4 IMPLANT
CLIP VESOCCLUDE SM WIDE 24/CT (CLIP) ×8 IMPLANT
CRADLE DONUT ADULT HEAD (MISCELLANEOUS) ×4 IMPLANT
DERMABOND ADVANCED (GAUZE/BANDAGES/DRESSINGS) ×2
DERMABOND ADVANCED .7 DNX12 (GAUZE/BANDAGES/DRESSINGS) ×2 IMPLANT
DRAIN CHANNEL 32F RND 10.7 FF (WOUND CARE) ×4 IMPLANT
DRAPE CARDIOVASCULAR INCISE (DRAPES) ×2
DRAPE SLUSH/WARMER DISC (DRAPES) ×4 IMPLANT
DRAPE SRG 135X102X78XABS (DRAPES) ×2 IMPLANT
DRSG AQUACEL AG ADV 3.5X14 (GAUZE/BANDAGES/DRESSINGS) ×4 IMPLANT
ELECT BLADE 4.0 EZ CLEAN MEGAD (MISCELLANEOUS) ×4
ELECT BLADE 6.5 EXT (BLADE) ×4 IMPLANT
ELECT CAUTERY BLADE 6.4 (BLADE) ×8 IMPLANT
ELECT REM PT RETURN 9FT ADLT (ELECTROSURGICAL) ×8
ELECTRODE BLDE 4.0 EZ CLN MEGD (MISCELLANEOUS) ×2 IMPLANT
ELECTRODE REM PT RTRN 9FT ADLT (ELECTROSURGICAL) ×4 IMPLANT
FELT TEFLON 1X6 (MISCELLANEOUS) ×4 IMPLANT
FLOSEAL 5ML (HEMOSTASIS) ×4 IMPLANT
GAUZE SPONGE 4X4 12PLY STRL LF (GAUZE/BANDAGES/DRESSINGS) ×12 IMPLANT
GLOVE BIO SURGEON STRL SZ 6.5 (GLOVE) ×15 IMPLANT
GLOVE BIO SURGEON STRL SZ7.5 (GLOVE) ×12 IMPLANT
GLOVE BIO SURGEONS STRL SZ 6.5 (GLOVE) ×5
GLOVE INDICATOR 6.5 STRL GRN (GLOVE) ×12 IMPLANT
GOWN STRL REUS W/ TWL LRG LVL3 (GOWN DISPOSABLE) ×16 IMPLANT
GOWN STRL REUS W/TWL LRG LVL3 (GOWN DISPOSABLE) ×16
HEMOSTAT SURGICEL 2X14 (HEMOSTASIS) ×4 IMPLANT
KIT BASIN OR (CUSTOM PROCEDURE TRAY) ×4 IMPLANT
KIT SUCTION CATH 14FR (SUCTIONS) ×4 IMPLANT
KIT TURNOVER KIT B (KITS) ×4 IMPLANT
KIT VASOVIEW HEMOPRO VH 3000 (KITS) ×4 IMPLANT
LEAD PACING MYOCARDI (MISCELLANEOUS) ×4 IMPLANT
MARKER GRAFT CORONARY BYPASS (MISCELLANEOUS) ×16 IMPLANT
NS IRRIG 1000ML POUR BTL (IV SOLUTION) ×20 IMPLANT
PACK E OPEN HEART (SUTURE) ×4 IMPLANT
PACK OPEN HEART (CUSTOM PROCEDURE TRAY) ×4 IMPLANT
PAD ARMBOARD 7.5X6 YLW CONV (MISCELLANEOUS) ×4 IMPLANT
PAD ELECT DEFIB RADIOL ZOLL (MISCELLANEOUS) ×4 IMPLANT
PENCIL BUTTON HOLSTER BLD 10FT (ELECTRODE) ×4 IMPLANT
POWDER SURGICEL 3.0 GRAM (HEMOSTASIS) ×12 IMPLANT
PUNCH AORTIC ROTATE  4.5MM 8IN (MISCELLANEOUS) ×4 IMPLANT
SET CARDIOPLEGIA MPS 5001102 (MISCELLANEOUS) ×4 IMPLANT
SUT BONE WAX W31G (SUTURE) ×4 IMPLANT
SUT MNCRL AB 4-0 PS2 18 (SUTURE) ×4 IMPLANT
SUT PROLENE 4 0 RB 1 (SUTURE) ×4
SUT PROLENE 4 0 SH DA (SUTURE) ×8 IMPLANT
SUT PROLENE 4-0 RB1 .5 CRCL 36 (SUTURE) ×4 IMPLANT
SUT PROLENE 5 0 C 1 36 (SUTURE) ×4 IMPLANT
SUT PROLENE 6 0 C 1 30 (SUTURE) ×16 IMPLANT
SUT PROLENE 6 0 CC (SUTURE) ×12 IMPLANT
SUT PROLENE BLUE 7 0 (SUTURE) ×8 IMPLANT
SUT SILK 2 0 SH CR/8 (SUTURE) ×4 IMPLANT
SUT STEEL 6MS V (SUTURE) ×4 IMPLANT
SUT STEEL SZ 6 DBL 3X14 BALL (SUTURE) ×8 IMPLANT
SUT VIC AB 1 CTX 36 (SUTURE) ×4
SUT VIC AB 1 CTX36XBRD ANBCTR (SUTURE) ×4 IMPLANT
SUT VIC AB 2-0 CT1 27 (SUTURE) ×4
SUT VIC AB 2-0 CT1 TAPERPNT 27 (SUTURE) ×4 IMPLANT
SYSTEM SAHARA CHEST DRAIN ATS (WOUND CARE) ×4 IMPLANT
TAPE CLOTH SURG 4X10 WHT LF (GAUZE/BANDAGES/DRESSINGS) ×8 IMPLANT
TAPE PAPER 2X10 WHT MICROPORE (GAUZE/BANDAGES/DRESSINGS) ×4 IMPLANT
TOWEL GREEN STERILE (TOWEL DISPOSABLE) ×4 IMPLANT
TOWEL GREEN STERILE FF (TOWEL DISPOSABLE) ×4 IMPLANT
TRAY FOLEY SLVR 16FR TEMP STAT (SET/KITS/TRAYS/PACK) ×4 IMPLANT
TUBING INSUFFLATION (TUBING) ×4 IMPLANT
UNDERPAD 30X30 (UNDERPADS AND DIAPERS) ×4 IMPLANT
WATER STERILE IRR 1000ML POUR (IV SOLUTION) ×8 IMPLANT

## 2017-09-09 NOTE — Anesthesia Procedure Notes (Signed)
Arterial Line Insertion Start/End7/12/2017 7:00 AM Performed by: Julieta Bellini, CRNA, CRNA  Patient location: Pre-op. Preanesthetic checklist: patient identified, IV checked, site marked, risks and benefits discussed, surgical consent, monitors and equipment checked, pre-op evaluation, timeout performed and anesthesia consent Lidocaine 1% used for infiltration Left, radial was placed Catheter size: 20 G Hand hygiene performed  and maximum sterile barriers used   Attempts: 1 Procedure performed without using ultrasound guided technique. Ultrasound Notes:no ultrasound evidence of intravascular and/or intraneural injection Following insertion, Biopatch and dressing applied. Post procedure assessment: normal  Patient tolerated the procedure well with no immediate complications.

## 2017-09-09 NOTE — Anesthesia Procedure Notes (Signed)
Central Venous Catheter Insertion Performed by: Duane Boston, MD, anesthesiologist Start/End7/12/2017 6:42 AM, 09/09/2017 7:52 AM Patient location: Pre-op. Preanesthetic checklist: patient identified, IV checked, site marked, risks and benefits discussed, surgical consent, monitors and equipment checked, pre-op evaluation, timeout performed and anesthesia consent Position: Trendelenburg Lidocaine 1% used for infiltration and patient sedated Hand hygiene performed , maximum sterile barriers used  and Seldinger technique used Catheter size: 8.5 Fr Total catheter length 8. PA cath was placed.Sheath introducer Swan type:thermodilution PA Cath depth:50 Procedure performed using ultrasound guided technique. Ultrasound Notes:anatomy identified, needle tip was noted to be adjacent to the nerve/plexus identified, no ultrasound evidence of intravascular and/or intraneural injection and image(s) printed for medical record Attempts: 1 Following insertion, line sutured and dressing applied. Post procedure assessment: free fluid flow, blood return through all ports and no air  Patient tolerated the procedure well with no immediate complications.

## 2017-09-09 NOTE — Progress Notes (Signed)
Pre Procedure note for inpatients:   Brendan Jackson has been scheduled for Procedure(s): CORONARY ARTERY BYPASS GRAFTING (CABG) (N/A) TRANSESOPHAGEAL ECHOCARDIOGRAM (TEE) (N/A) today. The various methods of treatment have been discussed with the patient. After consideration of the risks, benefits and treatment options the patient has consented to the planned procedure.   The patient has been seen and labs reviewed. There are no changes in the patient's condition to prevent proceeding with the planned procedure today.  Recent labs:  Lab Results  Component Value Date   WBC 8.8 09/09/2017   HGB 15.6 09/09/2017   HCT 46.3 09/09/2017   PLT 177 09/09/2017   GLUCOSE 105 (H) 09/09/2017   CHOL 157 09/06/2017   TRIG 61 09/06/2017   HDL 37 (L) 09/06/2017   LDLCALC 108 (H) 09/06/2017   NA 140 09/09/2017   K 3.5 09/09/2017   CL 105 09/09/2017   CREATININE 1.01 09/09/2017   BUN 12 09/09/2017   CO2 27 09/09/2017   TSH 4.336 09/07/2017   INR 1.17 09/07/2017   HGBA1C 5.4 09/07/2017    Len Childs, MD 09/09/2017 7:33 AM

## 2017-09-09 NOTE — Progress Notes (Signed)
Placed pt on CPAP mode per rapid wean protocol

## 2017-09-09 NOTE — Anesthesia Procedure Notes (Signed)
Procedure Name: Intubation Date/Time: 09/09/2017 8:00 AM Performed by: Julieta Bellini, CRNA Pre-anesthesia Checklist: Patient identified, Emergency Drugs available, Suction available and Patient being monitored Patient Re-evaluated:Patient Re-evaluated prior to induction Oxygen Delivery Method: Circle system utilized Preoxygenation: Pre-oxygenation with 100% oxygen Induction Type: IV induction Ventilation: Two handed mask ventilation required and Oral airway inserted - appropriate to patient size Laryngoscope Size: Mac and 4 Grade View: Grade I Tube type: Oral Tube size: 8.0 mm Number of attempts: 1 Airway Equipment and Method: Stylet Placement Confirmation: ETT inserted through vocal cords under direct vision,  positive ETCO2 and breath sounds checked- equal and bilateral Secured at: 23 cm Tube secured with: Tape Dental Injury: Teeth and Oropharynx as per pre-operative assessment

## 2017-09-09 NOTE — Progress Notes (Signed)
      [address]       [place] [address]             [phone]      S/p CABG x 4   BP 102/65   Pulse 89   Temp 100.2 F (37.9 C)   Resp 20   Ht 5\' 8"  (1.727 m)   Wt 204 lb 4.8 oz (92.7 kg)   SpO2 98%   BMI 31.06 kg/m    Intake/Output Summary (Last 24 hours) at 09/09/2017 1830 Last data filed at 09/09/2017 1800 Gross per 24 hour  Intake 5240.2 ml  Output 2842 ml  Net 2398.2 ml   Co = 5.9, CI= 2.9  Minimal CT output  Doing well early postop  Remo Lipps C. Roxan Hockey, MD [facility] [phone]

## 2017-09-09 NOTE — Op Note (Signed)
NAME: Brendan Jackson, Brendan Jackson MEDICAL RECORD JS:[id number] ACCOUNT [id number] DATE OF BIRTH:1950-07-05 FACILITY: [facility] LOCATION: [facility] PHYSICIAN:PETER VAN TRIGT III, MD  OPERATIVE REPORT  DATE OF PROCEDURE:  09/09/2017  PROCEDURE PERFORMED: 1.  Coronary artery bypass grafting x4 (left internal mammary artery to left anterior descending , saphenous vein graft to ramus intermedius, saphenous vein graft to obtuse marginal, saphenous vein graft to posterior descending). 2.  Bilateral leg saphenous vein endoscopic harvest.  SURGEON:  Len Childs, M.D.  ASSISTANT:  Ellwood Handler, PA-C  ANESTHESIA:  General by Dr. Adele Barthel.  PREOPERATIVE DIAGNOSIS:   Unstable angina, severe 3-vessel coronary artery disease, hypertension.  POSTOPERATIVE DIAGNOSIS:  Unstable angina, severe 3-vessel coronary artery disease, hypertension.  CLINICAL NOTE:  The patient is a 67 year old nonsmoker, who presented with symptoms of unstable angina and minimal elevation of cardiac enzymes.  He was admitted and placed on heparin and underwent catheterization.  This demonstrated severe 3-vessel  coronary artery disease with a preserved LV systolic function.  Echocardiogram showed no significant valvular disease.  His cardiologist recommended multivessel CABG.  I saw the patient in consultation and agreed with that recommendation for surgical  coronary revascularization.  I discussed the procedure of CABG in detail with the patient and wife including the indications and the expected benefits, the details of surgery including the location of surgical incisions and the use of general anesthesia  and cardiopulmonary bypass, the expected postoperative hospital recovery, and the potential risks to him of CABG including risks of stroke, bleeding, blood transfusion, postoperative pleural effusions, postoperative infection, and postoperative organ  failure and death.  He demonstrated his understanding and agreed to proceed with  surgery under what I felt was informed consent.  OPERATIVE FINDINGS: 1.  LVH, tortuous coronaries due to hypertension. 2.  Good conduit, but required both leg vein harvest. 3.  No blood products required for the surgery. 4.  Preservation of good LV global function after separation from cardiopulmonary bypass.  DESCRIPTION OF PROCEDURE:  The patient was brought to the operating room and placed supine on the operating table.  General anesthesia was induced under invasive hemodynamic monitoring.  A transesophageal echo probe was placed by the anesthesia team.   The chest, abdomen and legs were prepped with Betadine, draped as a sterile field.  A sternal incision was made as the saphenous vein was harvested from both legs using endoscopic technique.  The left internal mammary artery was harvested as a pedicle  graft from its origin at the subclavian vessels.  It was a good vessel with good flow, 1.5 mm in diameter.  The sternal retractor was placed using the deep blades because of the patient's obese body habitus.  The pericardium was opened and suspended.   Pursestrings were placed in the ascending aorta and right atrium.  Heparin was administered and ACT was documented as being therapeutic.  The patient was cannulated and placed on cardiopulmonary bypass.  The coronaries were identified for grafting.  The  mammary artery and vein grafts were prepared for the distal anastomoses and cardioplegia cannulas were placed both antegrade and retrograde cold blood cardioplegia.  The patient was cooled to 32 degrees and the aortic crossclamp was applied.  1.5 liters  of cold blood cardioplegia was delivered in split doses between the antegrade aortic and retrograde coronary sinus catheters.  There was good cardioplegic arrest and supple temperature dropped less than 14 degrees.  Cardioplegia was delivered every 20  minutes.  The distal coronary anastomoses were performed.  The first  distal anastomosis was to the  posterior descending branch of the right coronary.  This was a 1.5 mm vessel, proximal 90% stenosis.  Reverse saphenous vein was sewn end-to-side with running 7-0  Prolene with good flow through the graft.  Cardioplegia was redosed.  The second distal anastomosis was in the OM branch of the left circumflex.  There was a proximal 90% stenosis.  Reverse saphenous vein was sewn end-to-side with running 7-0 Prolene with good flow through the graft.  Cardioplegia was redosed.  The third distal anastomosis was the ramus intermedius branch of the left coronary.  This had approximately 80% stenosis.  A reverse saphenous vein was sewn end-to-side with running 7-0 Prolene with good flow through the graft.  Cardioplegia was redosed.  The fourth distal anastomosis was placed in the middle third of the LAD.  There was a proximal 95% stenosis.  The left IMA pedicle was brought through an opening and the left lateral pericardium was brought down onto the LAD and sewn end-to-side with  running 8-0 Prolene.  There was good flow through the anastomosis after briefly releasing the pedicle bulldog and the mammary artery.  The bulldog was reapplied and the pedicle was secured to the epicardium.  Cardioplegia was redosed.  A cross clamp was still in place.  Three proximal vein anastomoses were performed on the ascending aorta using a 4.5 mm punch and running 6-0 Prolene.  Prior to tying down the final proximal anastomosis, air was vented from the coronaries with a dose of  retrograde warm blood cardioplegia.  The crossclamp was removed.  The heart was cardioverted back to regular rhythm.  The vein grafts were de-aired and opened and each had good flow.  The patient was rewarmed and reperfused.  Hemostasis was documented at the proximal and distal anastomoses.  The lungs reexpanded.   Ventilator was resumed.  The patient was weaned off cardiopulmonary bypass without difficulty.  Echo showed preserved LV systolic function.   Protamine was administered without adverse reaction.  The cannulas were removed.  Hemostasis was achieved.  The  superior pericardial fat was closed over the aorta.  The anterior mediastinal and left pleural chest tubes were placed and brought out through separate incisions.  The sternum was closed with a wire.  The pectoralis fascia was closed with a running #1  Vicryl.  Subcutaneous and skin layers were closed using running Vicryl.    Total cardiopulmonary bypass time was 120 minutes.  AN/NUANCE  D:09/09/2017 T:09/09/2017 JOB:[id number]

## 2017-09-09 NOTE — Brief Op Note (Signed)
09/05/2017 - 09/09/2017  11:38 AM  PATIENT:  Brendan Jackson  67 y.o. male  PRE-OPERATIVE DIAGNOSIS:  CAD  POST-OPERATIVE DIAGNOSIS:  CAD  PROCEDURE:  Procedure(s):  CORONARY ARTERY BYPASS GRAFTING x 4 -LIMA to LAD -SVG to RAMUS INTERMEDIATE -SVG to OM -SVG to PDA  ENDOSCOPIC HARVEST GREATER SAPHENOUS VEIN -Right Leg -Left Thigh  TRANSESOPHAGEAL ECHOCARDIOGRAM (TEE) (N/A)  SURGEON:  Surgeon(s) and Role:    Ivin Poot, MD - Primary  PHYSICIAN ASSISTANT: Ellwood Handler PA-C  ANESTHESIA:   general  EBL: 300 cc   BLOOD ADMINISTERED: CELLSAVER  DRAINS: Left Pleural Chest Tube, Mediastinal Chest Drains   LOCAL MEDICATIONS USED:  NONE  SPECIMEN:  No Specimen  DISPOSITION OF SPECIMEN:  N/A  COUNTS:  YES  TOURNIQUET:  * No tourniquets in log *  DICTATION: .Dragon Dictation  PLAN OF CARE: Admit to inpatient   PATIENT DISPOSITION:  ICU - intubated and hemodynamically stable.   Delay start of Pharmacological VTE agent (>24hrs) due to surgical blood loss or risk of bleeding: yes

## 2017-09-09 NOTE — Procedures (Signed)
Extubation Procedure Note  Patient Details:   Name: Brendan Jackson DOB: 07/24/1950 MRN: [id number]   Airway Documentation:    Vent end date: 09/09/17 Vent end time: 1728   Evaluation  O2 sats: stable throughout Complications: No apparent complications Patient did tolerate procedure well. Bilateral Breath Sounds: Clear, Diminished   Yes   Patient was extubated to a 4L [place] without any complications, dyspnea or stridor noted. Patient was instructed on IS x 5, highest goal reached was 763mL. NIF: -28, VC: .9L.  Claretta Fraise 09/09/2017, 5:28 PM

## 2017-09-09 NOTE — Plan of Care (Signed)
  Problem: Education: Goal: Knowledge of General Education information will improve Outcome: Progressing   Problem: Health Behavior/Discharge Planning: Goal: Ability to manage health-related needs will improve Outcome: Progressing   Problem: Clinical Measurements: Goal: Ability to maintain clinical measurements within normal limits will improve Outcome: Progressing Goal: Will remain free from infection Outcome: Progressing Goal: Diagnostic test results will improve Outcome: Progressing Goal: Respiratory complications will improve Outcome: Progressing Goal: Cardiovascular complication will be avoided Outcome: Progressing   Problem: Activity: Goal: Risk for activity intolerance will decrease Outcome: Progressing   Problem: Nutrition: Goal: Adequate nutrition will be maintained Outcome: Progressing   Problem: Coping: Goal: Level of anxiety will decrease Outcome: Progressing   Problem: Elimination: Goal: Will not experience complications related to bowel motility Outcome: Progressing Goal: Will not experience complications related to urinary retention Outcome: Progressing   Problem: Pain Managment: Goal: General experience of comfort will improve Outcome: Progressing   Problem: Safety: Goal: Ability to remain free from injury will improve Outcome: Progressing   Problem: Skin Integrity: Goal: Risk for impaired skin integrity will decrease Outcome: Progressing   Problem: Education: Goal: Understanding of CV disease, CV risk reduction, and recovery process will improve Outcome: Progressing   Problem: Activity: Goal: Ability to return to baseline activity level will improve Outcome: Progressing   Problem: Cardiovascular: Goal: Ability to achieve and maintain adequate cardiovascular perfusion will improve Outcome: Progressing Goal: Vascular access site(s) Level 0-1 will be maintained Outcome: Progressing   Problem: Health Behavior/Discharge Planning: Goal:  Ability to safely manage health-related needs after discharge will improve Outcome: Progressing   Problem: Education: Goal: Ability to demonstrate proper wound care will improve Outcome: Progressing Goal: Knowledge of disease or condition will improve Outcome: Progressing Goal: Knowledge of the prescribed therapeutic regimen will improve Outcome: Progressing   Problem: Activity: Goal: Risk for activity intolerance will decrease Outcome: Progressing   Problem: Cardiac: Goal: Hemodynamic stability will improve Outcome: Progressing   Problem: Clinical Measurements: Goal: Postoperative complications will be avoided or minimized Outcome: Progressing   Problem: Respiratory: Goal: Respiratory status will improve Outcome: Progressing   Problem: Skin Integrity: Goal: Wound healing without signs and symptoms of infection Outcome: Progressing Goal: Risk for impaired skin integrity will decrease Outcome: Progressing   Problem: Urinary Elimination: Goal: Ability to achieve and maintain adequate renal perfusion and functioning will improve Outcome: Progressing

## 2017-09-09 NOTE — Transfer of Care (Signed)
Immediate Anesthesia Transfer of Care Note  Patient: Brendan Jackson  Procedure(s) Performed: CORONARY ARTERY BYPASS GRAFTING (CABG) x 4, USING LEFT INTERNAL MAMMARY ARTERY AND RIGHT AND LEFT GREATER SAPHENOUS VEIN HARVESTED ENDOSCOPICALLY (N/A Chest) TRANSESOPHAGEAL ECHOCARDIOGRAM (TEE) (N/A )  Patient Location: SICU  Anesthesia Type:General  Level of Consciousness: sedated and Patient remains intubated per anesthesia plan  Airway & Oxygen Therapy: Patient remains intubated per anesthesia plan and Patient placed on Ventilator (see vital sign flow sheet for setting)  Post-op Assessment: Report given to RN and Post -op Vital signs reviewed and stable  Post vital signs: Reviewed and stable  Last Vitals:  Vitals Value Taken Time  BP    Temp    Pulse    Resp 13 09/09/2017  1:38 PM  SpO2 96 % 09/09/2017  1:38 PM  Vitals shown include unvalidated device data.  Last Pain:  Vitals:   09/08/17 2250  TempSrc:   PainSc: 0-No pain      Patients Stated Pain Goal: 0 (74/82/70 7867)  Complications: No apparent anesthesia complications   Patient transported to ICU with standard monitors (HR, BP, SPO2, RR) and emergency drugs/equipment. Controlled ventilation maintained via ambu bag. Report given to bedside RN and respiratory therapist. Pt connected to ICU monitor and ventilator. All questions answered and vital signs stable before leaving

## 2017-09-10 ENCOUNTER — Encounter (HOSPITAL_COMMUNITY): Payer: Self-pay | Admitting: Cardiothoracic Surgery

## 2017-09-10 ENCOUNTER — Inpatient Hospital Stay (HOSPITAL_COMMUNITY): Payer: 59

## 2017-09-10 LAB — CBC
HEMATOCRIT: 34 % — AB (ref 39.0–52.0)
HEMATOCRIT: 38.2 % — AB (ref 39.0–52.0)
HEMOGLOBIN: 11.7 g/dL — AB (ref 13.0–17.0)
HEMOGLOBIN: 12.7 g/dL — AB (ref 13.0–17.0)
MCH: 33.3 pg (ref 26.0–34.0)
MCH: 32.9 pg (ref 26.0–34.0)
MCHC: 34.4 g/dL (ref 30.0–36.0)
MCHC: 33.2 g/dL (ref 30.0–36.0)
MCV: 96.9 fL (ref 78.0–100.0)
MCV: 99 fL (ref 78.0–100.0)
PLATELETS: 133 10*3/uL — AB (ref 150–400)
Platelets: 145 10*3/uL — ABNORMAL LOW (ref 150–400)
RBC: 3.51 MIL/uL — AB (ref 4.22–5.81)
RBC: 3.86 MIL/uL — AB (ref 4.22–5.81)
RDW: 12.8 % (ref 11.5–15.5)
RDW: 13.2 % (ref 11.5–15.5)
WBC: 9.8 10*3/uL (ref 4.0–10.5)
WBC: 11.7 10*3/uL — AB (ref 4.0–10.5)

## 2017-09-10 LAB — POCT I-STAT, CHEM 8
BUN: 16 mg/dL (ref 8–23)
CALCIUM ION: 1.21 mmol/L (ref 1.15–1.40)
Chloride: 99 mmol/L (ref 98–111)
Creatinine, Ser: 1 mg/dL (ref 0.61–1.24)
Glucose, Bld: 162 mg/dL — ABNORMAL HIGH (ref 70–99)
HCT: 38 % — ABNORMAL LOW (ref 39.0–52.0)
HEMOGLOBIN: 12.9 g/dL — AB (ref 13.0–17.0)
Potassium: 3.8 mmol/L (ref 3.5–5.1)
SODIUM: 135 mmol/L (ref 135–145)
TCO2: 22 mmol/L (ref 22–32)

## 2017-09-10 LAB — BASIC METABOLIC PANEL
Anion gap: 10 (ref 5–15)
BUN: 13 mg/dL (ref 8–23)
CHLORIDE: 105 mmol/L (ref 98–111)
CO2: 21 mmol/L — AB (ref 22–32)
Calcium: 8.1 mg/dL — ABNORMAL LOW (ref 8.9–10.3)
Creatinine, Ser: 1.06 mg/dL (ref 0.61–1.24)
GFR calc Af Amer: >60 mL/min (ref >60)
GLUCOSE: 102 mg/dL — AB (ref 70–99)
POTASSIUM: 3.9 mmol/L (ref 3.5–5.1)
Sodium: 136 mmol/L (ref 135–145)

## 2017-09-10 LAB — GLUCOSE, CAPILLARY
GLUCOSE-CAPILLARY: 101 mg/dL — AB (ref 70–99)
GLUCOSE-CAPILLARY: 104 mg/dL — AB (ref 70–99)
GLUCOSE-CAPILLARY: 108 mg/dL — AB (ref 70–99)
GLUCOSE-CAPILLARY: 110 mg/dL — AB (ref 70–99)
Glucose-Capillary: 103 mg/dL — ABNORMAL HIGH (ref 70–99)
Glucose-Capillary: 100 mg/dL — ABNORMAL HIGH (ref 70–99)
Glucose-Capillary: 101 mg/dL — ABNORMAL HIGH (ref 70–99)
Glucose-Capillary: 108 mg/dL — ABNORMAL HIGH (ref 70–99)
Glucose-Capillary: 89 mg/dL (ref 70–99)
Glucose-Capillary: 128 mg/dL — ABNORMAL HIGH (ref 70–99)

## 2017-09-10 LAB — MAGNESIUM
Magnesium: 2.3 mg/dL (ref 1.7–2.4)
Magnesium: 2.2 mg/dL (ref 1.7–2.4)

## 2017-09-10 LAB — CREATININE, SERUM
Creatinine, Ser: 1.22 mg/dL (ref 0.61–1.24)
GFR calc non Af Amer: >60 mL/min (ref >60)

## 2017-09-10 MED ORDER — KETOROLAC TROMETHAMINE 15 MG/ML IJ SOLN
15.0000 mg | Freq: Four times a day (QID) | INTRAMUSCULAR | Status: DC | PRN
  Administered 2017-09-10 (×2): 15 mg via INTRAVENOUS
  Filled 2017-09-10 (×2): qty 1

## 2017-09-10 MED ORDER — MILRINONE LACTATE IN DEXTROSE 20-5 MG/100ML-% IV SOLN
0.1250 ug/kg/min | INTRAVENOUS | Status: AC
  Administered 2017-09-10: 0.125 ug/kg/min via INTRAVENOUS
  Filled 2017-09-10: qty 100

## 2017-09-10 MED ORDER — VANCOMYCIN HCL IN DEXTROSE 1-5 GM/200ML-% IV SOLN
1000.0000 mg | Freq: Once | INTRAVENOUS | Status: AC
  Administered 2017-09-10: 1000 mg via INTRAVENOUS
  Filled 2017-09-10: qty 200

## 2017-09-10 MED ORDER — FUROSEMIDE 10 MG/ML IJ SOLN
20.0000 mg | Freq: Two times a day (BID) | INTRAMUSCULAR | Status: DC
  Administered 2017-09-10 – 2017-09-12 (×5): 20 mg via INTRAVENOUS
  Filled 2017-09-10 (×5): qty 2

## 2017-09-10 MED FILL — Magnesium Sulfate Inj 50%: INTRAMUSCULAR | Qty: 10 | Status: AC

## 2017-09-10 MED FILL — Potassium Chloride Inj 2 mEq/ML: INTRAVENOUS | Qty: 40 | Status: AC

## 2017-09-10 MED FILL — Heparin Sodium (Porcine) Inj 1000 Unit/ML: INTRAMUSCULAR | Qty: 30 | Status: AC

## 2017-09-10 NOTE — Progress Notes (Signed)
Cardiology Rounding Note:  The patient is doing well postoperative day #1 from multivessel CABG.  He is sitting up in the chair and his wife is at the bedside.  Patient is hemodynamically stable.  He is still on a low-dose of milrinone which I suspect will be discontinued in the near future.  Telemetry reviews normal sinus rhythm with no arrhythmia.  Medical program includes aspirin, beta-blocker, and a high intensity statin drug.  He seems to be progressing very well and our team will follow up on Monday unless any problems arise over the weekend.    Sherren Mocha MD 09/10/2017 1:29 PM

## 2017-09-10 NOTE — Anesthesia Postprocedure Evaluation (Signed)
Anesthesia Post Note  Patient: Cristopher Ciccarelli  Procedure(s) Performed: CORONARY ARTERY BYPASS GRAFTING (CABG) x 4, USING LEFT INTERNAL MAMMARY ARTERY AND RIGHT AND LEFT GREATER SAPHENOUS VEIN HARVESTED ENDOSCOPICALLY (N/A Chest) TRANSESOPHAGEAL ECHOCARDIOGRAM (TEE) (N/A )     Patient location during evaluation: SICU Anesthesia Type: General Level of consciousness: responds to stimulation Pain management: pain level controlled Vital Signs Assessment: post-procedure vital signs reviewed and stable Respiratory status: spontaneous breathing, nonlabored ventilation, respiratory function stable and patient connected to nasal cannula oxygen Cardiovascular status: stable Postop Assessment: no apparent nausea or vomiting Anesthetic complications: no    Last Vitals:  Vitals:   09/10/17 0600 09/10/17 0700  BP: 93/62 103/74  Pulse: (!) 56 (!) 55  Resp: 13 19  Temp: 37.8 C 37.5 C  SpO2: 95% 94%    Last Pain:  Vitals:   09/10/17 0600  TempSrc:   PainSc: 1                  Jalynn Betzold P Gwendloyn Forsee

## 2017-09-10 NOTE — Discharge Instructions (Signed)
Coronary Artery Bypass Grafting, Care After °These instructions give you information on caring for yourself after your procedure. Your doctor may also give you more specific instructions. Call your doctor if you have any problems or questions after your procedure. °Follow these instructions at home: °· Only take medicine as told by your doctor. Take medicines exactly as told. Do not stop taking medicines or start any new medicines without talking to your doctor first. °· Take your pulse as told by your doctor. °· Do deep breathing as told by your doctor. Use your breathing device (incentive spirometer), if given, to practice deep breathing several times a day. Support your chest with a pillow or your arms when you take deep breaths or cough. °· Keep the area clean, dry, and protected where the surgery cuts (incisions) were made. Remove bandages (dressings) only as told by your doctor. If strips were applied to surgical area, do not take them off. They fall off on their own. °· Check the surgery area daily for puffiness (swelling), redness, or leaking fluid. °· If surgery cuts were made in your legs: °? Avoid crossing your legs. °? Avoid sitting for long periods of time. Change positions every 30 minutes. °? Raise your legs when you are sitting. Place them on pillows. °· Wear stockings that help keep blood clots from forming in your legs (compression stockings). °· Only take sponge baths until your doctor says it is okay to take showers. Pat the surgery area dry. Do not rub the surgery area with a washcloth or towel. Do not bathe, swim, or use a hot tub until your doctor says it is okay. °· Eat foods that are high in fiber. These include raw fruits and vegetables, whole grains, beans, and nuts. Choose lean meats. Avoid canned, processed, and fried foods. °· Drink enough fluids to keep your pee (urine) clear or pale yellow. °· Weigh yourself every day. °· Rest and limit activity as told by your doctor. You may be told  to: °? Stop any activity if you have chest pain, shortness of breath, changes in heartbeat, or dizziness. Get help right away if this happens. °? Move around often for short amounts of time or take short walks as told by your doctor. Gradually become more active. You may need help to strengthen your muscles and build endurance. °? Avoid lifting, pushing, or pulling anything heavier than 10 pounds (4.5 kg) for at least 6 weeks after surgery. °· Do not drive until your doctor says it is okay. °· Ask your doctor when you can go back to work. °· Ask your doctor when you can begin sexual activity again. °· Follow up with your doctor as told. °Contact a doctor if: °· You have puffiness, redness, more pain, or fluid draining from the incision site. °· You have a fever. °· You have puffiness in your ankles or legs. °· You have pain in your legs. °· You gain 2 or more pounds (0.9 kg) a day. °· You feel sick to your stomach (nauseous) or throw up (vomit). °· You have watery poop (diarrhea). °Get help right away if: °· You have chest pain that goes to your jaw or arms. °· You have shortness of breath. °· You have a fast or irregular heartbeat. °· You notice a "clicking" in your breastbone when you move. °· You have numbness or weakness in your arms or legs. °· You feel dizzy or light-headed. °This information is not intended to replace advice given to you by   your health care provider. Make sure you discuss any questions you have with your health care provider. °Document Released: 02/21/2013 Document Revised: 07/25/2015 Document Reviewed: 07/26/2012 °Elsevier Interactive Patient Education © 2017 [employer] ° °

## 2017-09-10 NOTE — Progress Notes (Signed)
1 Day Post-Op Procedure(s) (LRB): CORONARY ARTERY BYPASS GRAFTING (CABG) x 4, USING LEFT INTERNAL MAMMARY ARTERY AND RIGHT AND LEFT GREATER SAPHENOUS VEIN HARVESTED ENDOSCOPICALLY (N/A) TRANSESOPHAGEAL ECHOCARDIOGRAM (TEE) (N/A) Subjective: Stable after multivessel CABG for unstable angina Extubated the evening of surgery Maintaining sinus rhythm Hemodynamics stable, milrinone will be weaned off by later tonight Chest tubes out today, chest x-ray clear  Objective: Vital signs in last 24 hours: Temp:  [98.1 F (36.7 C)-101.7 F (38.7 C)] 99.5 F (37.5 C) (07/12 1636) Pulse Rate:  [55-78] 72 (07/12 1300) Cardiac Rhythm: Sinus bradycardia;Normal sinus rhythm (07/12 0800) Resp:  [13-31] 25 (07/12 1300) BP: (93-154)/(55-82) 154/80 (07/12 1300) SpO2:  [79 %-99 %] 95 % (07/12 1300) Arterial Line BP: (99-166)/(45-81) 145/62 (07/12 0900) Weight:  [216 lb 6.4 oz (98.2 kg)] 216 lb 6.4 oz (98.2 kg) (07/12 0500)  Hemodynamic parameters for last 24 hours: PAP: (19-43)/(5-20) 32/13 CO:  [5.3 L/min-5.9 L/min] 5.3 L/min CI:  [2.6 L/min/m2-2.9 L/min/m2] 2.6 L/min/m2  Intake/Output from previous day: 07/11 0701 - 07/12 0700 In: 7367.8 [P.O.:190; I.V.:3795.3; Blood:500; IV Piggyback:2867.5] Out: 2979 [Urine:2345; Blood:1000; Chest Tube:482] Intake/Output this shift: Total I/O In: 498.8 [P.O.:120; I.V.:178.7; IV Piggyback:200.1] Out: 720 [Urine:650; Chest Tube:70]   Exam Alert responsive Lungs clear No murmur Abdomen soft Extremities warm   Lab Results: Recent Labs    09/09/17 2000 09/09/17 2005 09/10/17 0405  WBC 12.5*  --  9.8  HGB 12.4* 12.6* 11.7*  HCT 36.9* 37.0* 34.0*  PLT 145*  --  133*   BMET:  Recent Labs    09/09/17 0536  09/09/17 2005 09/10/17 0405  NA 140   < > 138 136  K 3.5   < > 4.3 3.9  CL 105   < > 104 105  CO2 27  --   --  21*  GLUCOSE 105*   < > 106* 102*  BUN 12   < > 12 13  CREATININE 1.01   < > 0.80 1.06  CALCIUM 9.2  --   --  8.1*   < > =  values in this interval not displayed.    PT/INR:  Recent Labs    09/09/17 1351  LABPROT 17.3*  INR 1.43   ABG    Component Value Date/Time   PHART 7.338 (L) 09/09/2017 1834   HCO3 20.4 09/09/2017 1834   TCO2 20 (L) 09/09/2017 2005   ACIDBASEDEF 5.0 (H) 09/09/2017 1834   O2SAT 96.0 09/09/2017 1834   CBG (last 3)  Recent Labs    09/10/17 0657 09/10/17 0804 09/10/17 0922  GLUCAP 108* 89 128*    Assessment/Plan: S/P Procedure(s) (LRB): CORONARY ARTERY BYPASS GRAFTING (CABG) x 4, USING LEFT INTERNAL MAMMARY ARTERY AND RIGHT AND LEFT GREATER SAPHENOUS VEIN HARVESTED ENDOSCOPICALLY (N/A) TRANSESOPHAGEAL ECHOCARDIOGRAM (TEE) (N/A) Mobilize Diuresis Diabetes control d/c tubes/lines   LOS: 5 days    Brendan Jackson 09/10/2017

## 2017-09-10 NOTE — Progress Notes (Signed)
Patient ID: Brendan Jackson, male   DOB: [birth year], 67 y.o.   MRN: [id number] EVENING ROUNDS NOTE :     [address]       [place] [address]             [phone]                 1 Day Post-Op Procedure(s) (LRB): CORONARY ARTERY BYPASS GRAFTING (CABG) x 4, USING LEFT INTERNAL MAMMARY ARTERY AND RIGHT AND LEFT GREATER SAPHENOUS VEIN HARVESTED ENDOSCOPICALLY (N/A) TRANSESOPHAGEAL ECHOCARDIOGRAM (TEE) (N/A)  Total Length of Stay:  LOS: 5 days  BP (!) 167/93   Pulse 68   Temp 99.5 F (37.5 C) (Oral)   Resp (!) 25   Ht 5\' 8"  (1.727 m)   Wt 216 lb 6.4 oz (98.2 kg)   SpO2 90%   BMI 32.90 kg/m   .Intake/Output      07/12 0701 - 07/13 0700   P.O. 600   I.V. (mL/kg) 312.9 (3.2)   Blood    Other    IV Piggyback 200.1   Total Intake(mL/kg) 1113 (11.3)   Urine (mL/kg/hr) 975 (0.8)   Blood    Chest Tube 210   Total Output 1185   Net -72         . sodium chloride Stopped (09/10/17 0923)  . sodium chloride    . sodium chloride 10 mL/hr at 09/09/17 1402  . cefUROXime (ZINACEF)  IV 1.5 g (09/10/17 1735)  . insulin (NOVOLIN-R) infusion 1.4 mL/hr at 09/10/17 1000  . lactated ringers    . lactated ringers 20 mL/hr at 09/10/17 1600  . milrinone 0.125 mcg/kg/min (09/10/17 1755)  . nitroGLYCERIN Stopped (09/09/17 1856)  . phenylephrine (NEO-SYNEPHRINE) Adult infusion Stopped (09/09/17 2342)     Lab Results  Component Value Date   WBC 11.7 (H) 09/10/2017   HGB 12.9 (L) 09/10/2017   HCT 38.0 (L) 09/10/2017   PLT 145 (L) 09/10/2017   GLUCOSE 162 (H) 09/10/2017   CHOL 157 09/06/2017   TRIG 61 09/06/2017   HDL 37 (L) 09/06/2017   LDLCALC 108 (H) 09/06/2017   NA 135 09/10/2017   K 3.8 09/10/2017   CL 99 09/10/2017   CREATININE 1.00 09/10/2017   BUN 16 09/10/2017   CO2 21 (L) 09/10/2017   TSH 4.336 09/07/2017   INR 1.43 09/09/2017   HGBA1C 5.4 09/07/2017   Stable day Walked in hall short distance     [place] [phone] Office  [phone] 09/10/2017 7:38 PM

## 2017-09-10 NOTE — Discharge Summary (Signed)
Physician Discharge Summary       [address]       [place] [address]             [phone]    Patient ID: Brendan Jackson MRN: [id number] DOB/AGE: [birth year] 65 y.o.  Admit date: 09/05/2017 Discharge date: 09/14/2017  Admission Diagnoses: 1. S/p NSTEMI (non-ST elevated myocardial infarction) ([place]) 2. Coronary artery disease  Discharge Diagnoses:  1.  S/P CABG x 4 2. ABL anemia 3. Mild thrombocytopenia 4. History of HTN (hypertension) 5. History of prostate cancer     Procedure (s):  LEFT HEART CATH AND CORONARY ANGIOGRAPHY  Conclusion     Ost LAD to Prox LAD lesion is 95% stenosed.  Ost 1st Mrg lesion is 70% stenosed.  Ost Ramus lesion is 95% stenosed.  Mid RCA lesion is 50% stenosed.  Dist RCA lesion is 95% stenosed.  There is mild to moderate left ventricular systolic dysfunction.  LV end diastolic pressure is normal.  The left ventricular ejection fraction is 45-50% by visual estimate.   Case Brendan Jackson is a 67 y.o. male    [id number] LOCATION:  FACILITY: [facility]  PHYSICIAN: Quay Burow, M.D. 12/19/1950   DATE OF PROCEDURE:  09/06/2017    1.  Coronary artery bypass grafting x4 (left internal mammary artery to left anterior descending , saphenous vein graft to ramus intermedius, saphenous vein graft to obtuse marginal, saphenous vein graft to posterior descending). 2.  Bilateral leg saphenous vein endoscopic harvest by Dr. Prescott Gum on 09/09/2017.   History of Presenting Illness: Patient examined, coronary angiogram images personally reviewed and counseled with patient and wife.  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  He underwent cardiac catheterization today which demonstrates severe three-vessel coronary artery disease with preserved LV function.  LVEDP is  normal. Patient is scheduled to have robotic prostatectomy for biopsy-proven adenocarcinoma prostate later this week.  No urinary symptoms other than urgency. Patient has positive family history of CAD-one younger brother suddenly died of a massive MI.  Patient has history of hypertension but denies diabetes, smoking, hyperlipidemia.   Dr. Prescott Gum discussed the need for coronary artery bypass grafting surgery. Potential risks, benefits, and complications of the surgery were discussed with the patient and he agreed to proceed with surgery. Pre operative carotid duplex US showed no significant internal carotid artery stenosis bilaterally. Patient underwent a CABG x 4 on 09/09/2017.  Brief Hospital Course:  The patient was extubated early the evening of surgery without difficulty. He remained afebrile and hemodynamically stable. Gordy Councilman, a line, chest tubes, and foley were removed early in the post operative course. Coreg was started and titrated accordingly. H did have PACs initially post op. He was volume over loaded and diuresed. He had ABL anemia. He did not require a post op transfusion. Last H and H was 11.4 and 33.8.  He had mild thrombocytopenia post op but this did resolve as last platelets were up to 171,000. He was weaned off the insulin drip.  The patient's HGA1C pre op was 5.4 . The patient was felt surgically stable for transfer from the ICU to PCTU for further convalescence on 09/12/2017. He was started on Lisinopril for better BP control. He continues to progress with cardiac rehab. He was ambulating on room air. He has been tolerating a diet and has had a bowel movement. Epicardial pacing wires  were removed on 09/13/2017. Chest tube sutures will be removed the day of discharge. The patient is felt surgically stable for discharge today.   Latest Vital Signs: Blood pressure 107/60, pulse (!) 59, temperature 99.3 F (37.4 C), temperature source Oral, resp. rate 20, height 5\' 8"  (1.727 m),  weight 200 lb 12.8 oz (91.1 kg), SpO2 94 %.  Physical Exam: General appearance: alert, cooperative and no distress Heart: regular rate and rhythm Lungs: diminished breath sounds on left Abdomen: soft, non-tender; bowel sounds normal; no masses,  no organomegaly Extremities: edema trace Wound: clean and dry, ecchymosis of BLE   Discharge Condition: Stable and discharged to home.  Recent laboratory studies:  Lab Results  Component Value Date   WBC 9.0 09/13/2017   HGB 11.4 (L) 09/13/2017   HCT 33.8 (L) 09/13/2017   MCV 97.1 09/13/2017   PLT 171 09/13/2017   Lab Results  Component Value Date   NA 139 09/13/2017   K 3.7 09/13/2017   CL 103 09/13/2017   CO2 27 09/13/2017   CREATININE 0.98 09/13/2017   GLUCOSE 111 (H) 09/13/2017    Diagnostic Studies: Dg Chest 2 View  Result Date: 09/13/2017 CLINICAL DATA:  Status post cardiac surgery, postop day 4. EXAM: CHEST - 2 VIEW COMPARISON:  09/12/2017 and older studies. FINDINGS: Cardiac silhouette is size. No mediastinal widening. No mediastinal masses. Left basilar atelectasis has mostly resolved. Lungs otherwise clear. Small pleural effusions. No pneumothorax. All lines and tubes have been removed. IMPRESSION: 1. No acute findings.  No evidence of an operative complication. 2. Left lung base atelectasis had mostly resolved. Small residual pleural effusions. Electronically Signed   By: Lajean Manes M.D.   On: 09/13/2017 07:42    Discharge Instructions    Amb Referral to Cardiac Rehabilitation   Complete by:  As directed    Diagnosis:   CABG NSTEMI     CABG X ___:  4      Discharge Medications: Allergies as of 09/14/2017   No Known Allergies     Medication List    STOP taking these medications   ibuprofen 200 MG tablet Commonly known as:  ADVIL,MOTRIN     TAKE these medications   acetaminophen 325 MG tablet Commonly known as:  TYLENOL Take 2 tablets (650 mg total) by mouth every 6 (six) hours as needed for mild  pain.   aspirin 325 MG EC tablet Take 1 tablet (325 mg total) by mouth daily. What changed:    medication strength  how much to take   atorvastatin 80 MG tablet Commonly known as:  LIPITOR Take 1 tablet (80 mg total) by mouth daily at 6 PM.   carvedilol 25 MG tablet Commonly known as:  COREG Take 1 tablet (25 mg total) by mouth 2 (two) times daily with a meal. What changed:  how much to take   lisinopril 5 MG tablet Commonly known as:  PRINIVIL,ZESTRIL Take 1 tablet (5 mg total) by mouth daily.   oxyCODONE 5 MG immediate release tablet Commonly known as:  Oxy IR/ROXICODONE Take 1 tablet (5 mg total) by mouth every 4 (four) hours as needed for severe pain.      The patient has been discharged on:   1.Beta Blocker:  Yes [x   ]                              No   [   ]  If No, reason:  2.Ace Inhibitor/ARB: Yes [ x  ]                                     No  [    ]                                     If No, reason:  3.Statin:   Yes [ x  ]                  No  [   ]                  If No, reason:  4.Ecasa:  Yes  [ x  ]                  No   [   ]                  If No, reason:  Follow Up Appointments: Follow-up Information    Prescott Gum, Collier [place], MD. Go on 10/18/2017.   Specialty:  Cardiothoracic Surgery Why:  PA/LAT CXR to be taken (at [facility] which is in the same building as Dr. Lucianne Lei Trigt's office) on 08/19 at 1:00 pm;Appointment time is at 1:30 pm Contact information: [place] [address] [phone]        Lendon Colonel, NP. Go on 09/30/2017.   Specialties:  Nurse Practitioner, Radiology, Cardiology Why:  Appointment time is at [facility] information: [address] [place] [address] [place], [place] Follow up.   Specialty:  [place] Why:  HHRN/PT arranged - they will call you to set up home visits Contact  information: [address] [place] [address] [phone]           Signed: Ellamae Sia 09/14/2017, 7:40 AM

## 2017-09-11 ENCOUNTER — Inpatient Hospital Stay (HOSPITAL_COMMUNITY): Payer: 59

## 2017-09-11 DIAGNOSIS — Z951 Presence of aortocoronary bypass graft: Secondary | ICD-10-CM

## 2017-09-11 DIAGNOSIS — I454 Nonspecific intraventricular block: Secondary | ICD-10-CM | POA: Diagnosis not present

## 2017-09-11 DIAGNOSIS — I213 ST elevation (STEMI) myocardial infarction of unspecified site: Secondary | ICD-10-CM | POA: Diagnosis not present

## 2017-09-11 DIAGNOSIS — I491 Atrial premature depolarization: Secondary | ICD-10-CM | POA: Diagnosis not present

## 2017-09-11 DIAGNOSIS — I25110 Atherosclerotic heart disease of native coronary artery with unstable angina pectoris: Secondary | ICD-10-CM | POA: Diagnosis not present

## 2017-09-11 DIAGNOSIS — D62 Acute posthemorrhagic anemia: Secondary | ICD-10-CM | POA: Diagnosis not present

## 2017-09-11 DIAGNOSIS — E8770 Fluid overload, unspecified: Secondary | ICD-10-CM | POA: Diagnosis not present

## 2017-09-11 DIAGNOSIS — I10 Essential (primary) hypertension: Secondary | ICD-10-CM | POA: Diagnosis not present

## 2017-09-11 DIAGNOSIS — C61 Malignant neoplasm of prostate: Secondary | ICD-10-CM | POA: Diagnosis not present

## 2017-09-11 DIAGNOSIS — D6959 Other secondary thrombocytopenia: Secondary | ICD-10-CM | POA: Diagnosis not present

## 2017-09-11 LAB — BASIC METABOLIC PANEL
Anion gap: 5 (ref 5–15)
BUN: 13 mg/dL (ref 8–23)
CO2: 26 mmol/L (ref 22–32)
Calcium: 8.4 mg/dL — ABNORMAL LOW (ref 8.9–10.3)
Chloride: 105 mmol/L (ref 98–111)
Creatinine, Ser: 0.99 mg/dL (ref 0.61–1.24)
GFR calc Af Amer: >60 mL/min (ref >60)
GFR calc non Af Amer: >60 mL/min (ref >60)
Glucose, Bld: 112 mg/dL — ABNORMAL HIGH (ref 70–99)
Potassium: 4 mmol/L (ref 3.5–5.1)
Sodium: 136 mmol/L (ref 135–145)

## 2017-09-11 LAB — CBC
HCT: 34.7 % — ABNORMAL LOW (ref 39.0–52.0)
Hemoglobin: 11.5 g/dL — ABNORMAL LOW (ref 13.0–17.0)
MCH: 33 pg (ref 26.0–34.0)
MCHC: 33.1 g/dL (ref 30.0–36.0)
MCV: 99.4 fL (ref 78.0–100.0)
Platelets: 117 10*3/uL — ABNORMAL LOW (ref 150–400)
RBC: 3.49 MIL/uL — ABNORMAL LOW (ref 4.22–5.81)
RDW: 13.2 % (ref 11.5–15.5)
WBC: 10.6 10*3/uL — ABNORMAL HIGH (ref 4.0–10.5)

## 2017-09-11 NOTE — Progress Notes (Signed)
Patient ID: Brendan Jackson, male   DOB: 08-13-1950, 67 y.o.   MRN: [id number] TCTS DAILY ICU PROGRESS NOTE                   [address]            [place] [address]          [phone]   2 Days Post-Op Procedure(s) (LRB): CORONARY ARTERY BYPASS GRAFTING (CABG) x 4, USING LEFT INTERNAL MAMMARY ARTERY AND RIGHT AND LEFT GREATER SAPHENOUS VEIN HARVESTED ENDOSCOPICALLY (N/A) TRANSESOPHAGEAL ECHOCARDIOGRAM (TEE) (N/A)  Total Length of Stay:  LOS: 6 days   Subjective: Patient awake, alert sitting in chair feels comfortable this morning  Objective: Vital signs in last 24 hours: Temp:  [98.1 F (36.7 C)-99.5 F (37.5 C)] 98.4 F (36.9 C) (07/13 0300) Pulse Rate:  [54-81] 61 (07/13 0800) Cardiac Rhythm: Sinus bradycardia;Normal sinus rhythm (07/13 0800) Resp:  [14-26] 14 (07/13 0800) BP: (113-172)/(68-93) 132/82 (07/13 0800) SpO2:  [90 %-97 %] 95 % (07/13 0800) Arterial Line BP: (145)/(62) 145/62 (07/12 0900) Weight:  [212 lb 4.9 oz (96.3 kg)] 212 lb 4.9 oz (96.3 kg) (07/13 0500)  Filed Weights   09/09/17 0503 09/10/17 0500 09/11/17 0500  Weight: 204 lb 4.8 oz (92.7 kg) 216 lb 6.4 oz (98.2 kg) 212 lb 4.9 oz (96.3 kg)    Weight change: -4 lb 1.6 oz (-1.858 kg)   Hemodynamic parameters for last 24 hours: PAP: (32)/(13) 32/13  Intake/Output from previous day: 07/12 0701 - 07/13 0700 In: 1360 [P.O.:600; I.V.:559.9; IV Piggyback:200.1] Out: 2485 [Urine:2275; Chest Tube:210]  Intake/Output this shift: Total I/O In: 40 [I.V.:40] Out: 345 [Urine:225; Chest Tube:120]  Current Meds: Scheduled Meds: . acetaminophen  1,000 mg Oral Q6H   Or  . acetaminophen (TYLENOL) oral liquid 160 mg/5 mL  1,000 mg Per Tube Q6H  . aspirin EC  325 mg Oral Daily   Or  . aspirin  324 mg Per Tube Daily  . atorvastatin  80 mg Oral q1800  . bisacodyl  10 mg Oral Daily   Or  . bisacodyl  10 mg Rectal Daily  . Chlorhexidine Gluconate Cloth  6 each Topical Daily  . docusate  sodium  200 mg Oral Daily  . furosemide  20 mg Intravenous BID  . mouth rinse  15 mL Mouth Rinse BID  . metoprolol tartrate  12.5 mg Oral BID   Or  . metoprolol tartrate  12.5 mg Per Tube BID  . mupirocin ointment  1 application Nasal BID  . pantoprazole  40 mg Oral Daily  . sodium chloride flush  3 mL Intravenous Q12H   Continuous Infusions: . sodium chloride Stopped (09/10/17 0923)  . sodium chloride    . sodium chloride 10 mL/hr at 09/09/17 1402  . insulin (NOVOLIN-R) infusion 1.4 mL/hr at 09/10/17 1000  . lactated ringers    . lactated ringers 20 mL/hr at 09/10/17 1600  . nitroGLYCERIN Stopped (09/09/17 1856)  . phenylephrine (NEO-SYNEPHRINE) Adult infusion Stopped (09/09/17 2342)   PRN Meds:.sodium chloride, ketorolac, metoprolol tartrate, midazolam, morphine injection, ondansetron (ZOFRAN) IV, oxyCODONE, sodium chloride flush, traMADol  General appearance: alert, cooperative and no distress Neurologic: intact Heart: regular rate and rhythm, S1, S2 normal, no murmur, click, rub or gallop Lungs: diminished breath sounds bibasilar Abdomen: soft, non-tender; bowel sounds normal; no masses,  no organomegaly Extremities: extremities normal, atraumatic, no cyanosis or edema and Homans sign is negative, no sign of DVT Wound: Sternum stable  Lab Results: CBC: Recent Labs    09/10/17 1731 09/10/17 1732 09/11/17 0317  WBC 11.7*  --  10.6*  HGB 12.7* 12.9* 11.5*  HCT 38.2* 38.0* 34.7*  PLT 145*  --  117*   BMET:  Recent Labs    09/10/17 0405  09/10/17 1732 09/11/17 0317  NA 136  --  135 136  K 3.9  --  3.8 4.0  CL 105  --  99 105  CO2 21*  --   --  26  GLUCOSE 102*  --  162* 112*  BUN 13  --  16 13  CREATININE 1.06   < > 1.00 0.99  CALCIUM 8.1*  --   --  8.4*   < > = values in this interval not displayed.    CMET: Lab Results  Component Value Date   WBC 10.6 (H) 09/11/2017   HGB 11.5 (L) 09/11/2017   HCT 34.7 (L) 09/11/2017   PLT 117 (L) 09/11/2017   GLUCOSE  112 (H) 09/11/2017   CHOL 157 09/06/2017   TRIG 61 09/06/2017   HDL 37 (L) 09/06/2017   LDLCALC 108 (H) 09/06/2017   NA 136 09/11/2017   K 4.0 09/11/2017   CL 105 09/11/2017   CREATININE 0.99 09/11/2017   BUN 13 09/11/2017   CO2 26 09/11/2017   TSH 4.336 09/07/2017   INR 1.43 09/09/2017   HGBA1C 5.4 09/07/2017      PT/INR:  Recent Labs    09/09/17 1351  LABPROT 17.3*  INR 1.43   Radiology: Dg Chest Port 1 View  Result Date: 09/11/2017 CLINICAL DATA:  CABG EXAM: PORTABLE CHEST 1 VIEW COMPARISON:  09/10/2017 FINDINGS: Left chest tube removed. Mediastinal drain remains in place. Swan-Ganz catheter removed. No pneumothorax Improved aeration in the lung bases with persistent left lower lobe atelectasis and small left effusion. Negative for edema. IMPRESSION: No pneumothorax post left chest tube removal Improved aeration in the lung bases with persistent left lower lobe atelectasis. Electronically Signed   By: Franchot Gallo M.D.   On: 09/11/2017 07:25     Assessment/Plan: S/P Procedure(s) (LRB): CORONARY ARTERY BYPASS GRAFTING (CABG) x 4, USING LEFT INTERNAL MAMMARY ARTERY AND RIGHT AND LEFT GREATER SAPHENOUS VEIN HARVESTED ENDOSCOPICALLY (N/A) TRANSESOPHAGEAL ECHOCARDIOGRAM (TEE) (N/A) Mobilize Diuresis Frequent PACs earlier this morning, holding in sinus rhythm DC mediastinal tube Renal function stable DC Foley-patient has known prostate cancer was to have prostatectomy last week   [name] 09/11/2017 8:39 AM

## 2017-09-11 NOTE — Progress Notes (Signed)
Patient ID: Brendan Jackson, male   DOB: 21-Jun-1950, 67 y.o.   MRN: [id number] EVENING ROUNDS NOTE :     [address]       [place] [address]             [phone]                 2 Days Post-Op Procedure(s) (LRB): CORONARY ARTERY BYPASS GRAFTING (CABG) x 4, USING LEFT INTERNAL MAMMARY ARTERY AND RIGHT AND LEFT GREATER SAPHENOUS VEIN HARVESTED ENDOSCOPICALLY (N/A) TRANSESOPHAGEAL ECHOCARDIOGRAM (TEE) (N/A)  Total Length of Stay:  LOS: 6 days  BP 134/80   Pulse 75   Temp 99.5 F (37.5 C) (Oral)   Resp (!) 33   Ht 5\' 8"  (1.727 m)   Wt 212 lb 4.9 oz (96.3 kg)   SpO2 98%   BMI 32.28 kg/m   .Intake/Output      07/13 0701 - 07/14 0700   P.O. 720   I.V. (mL/kg) 100 (1)   IV Piggyback 0   Total Intake(mL/kg) 820 (8.5)   Urine (mL/kg/hr) 1550 (1.2)   Chest Tube 170   Total Output 1720   Net -900         . sodium chloride Stopped (09/10/17 0923)  . sodium chloride    . sodium chloride 10 mL/hr at 09/09/17 1402  . insulin (NOVOLIN-R) infusion 1.4 mL/hr at 09/10/17 1000  . lactated ringers    . lactated ringers Stopped (09/11/17 1100)  . nitroGLYCERIN Stopped (09/09/17 1856)  . phenylephrine (NEO-SYNEPHRINE) Adult infusion Stopped (09/09/17 2342)     Lab Results  Component Value Date   WBC 10.6 (H) 09/11/2017   HGB 11.5 (L) 09/11/2017   HCT 34.7 (L) 09/11/2017   PLT 117 (L) 09/11/2017   GLUCOSE 112 (H) 09/11/2017   CHOL 157 09/06/2017   TRIG 61 09/06/2017   HDL 37 (L) 09/06/2017   LDLCALC 108 (H) 09/06/2017   NA 136 09/11/2017   K 4.0 09/11/2017   CL 105 09/11/2017   CREATININE 0.99 09/11/2017   BUN 13 09/11/2017   CO2 26 09/11/2017   TSH 4.336 09/07/2017   INR 1.43 09/09/2017   HGBA1C 5.4 09/07/2017   Stable day Voiding ok   [name] MD  B[facility] [phone] Office [phone] 09/11/2017 8:00 PM

## 2017-09-11 NOTE — Plan of Care (Signed)
Patient voiding well post catheter removal, coping well with anxiety an post-op pain.

## 2017-09-12 ENCOUNTER — Inpatient Hospital Stay (HOSPITAL_COMMUNITY): Payer: 59

## 2017-09-12 LAB — TYPE AND SCREEN
ABO/RH(D): AB POS
Antibody Screen: NEGATIVE
Unit division: 0
Unit division: 0

## 2017-09-12 LAB — CBC
HCT: 34.3 % — ABNORMAL LOW (ref 39.0–52.0)
Hemoglobin: 11.5 g/dL — ABNORMAL LOW (ref 13.0–17.0)
MCH: 33.1 pg (ref 26.0–34.0)
MCHC: 33.5 g/dL (ref 30.0–36.0)
MCV: 98.8 fL (ref 78.0–100.0)
Platelets: 135 K/uL — ABNORMAL LOW (ref 150–400)
RBC: 3.47 MIL/uL — ABNORMAL LOW (ref 4.22–5.81)
RDW: 12.8 % (ref 11.5–15.5)
WBC: 10.4 K/uL (ref 4.0–10.5)

## 2017-09-12 LAB — BASIC METABOLIC PANEL
Anion gap: 7 (ref 5–15)
BUN: 16 mg/dL (ref 8–23)
CO2: 27 mmol/L (ref 22–32)
Calcium: 8.5 mg/dL — ABNORMAL LOW (ref 8.9–10.3)
Chloride: 104 mmol/L (ref 98–111)
Creatinine, Ser: 0.88 mg/dL (ref 0.61–1.24)
GFR calc Af Amer: >60 mL/min (ref >60)
GFR calc non Af Amer: >60 mL/min (ref >60)
Glucose, Bld: 109 mg/dL — ABNORMAL HIGH (ref 70–99)
Potassium: 3.8 mmol/L (ref 3.5–5.1)
Sodium: 138 mmol/L (ref 135–145)

## 2017-09-12 LAB — BPAM RBC
Blood Product Expiration Date: 201908032359
Blood Product Expiration Date: 201908032359
Unit Type and Rh: 8400
Unit Type and Rh: 8400

## 2017-09-12 MED ORDER — SODIUM CHLORIDE 0.9% FLUSH
3.0000 mL | Freq: Two times a day (BID) | INTRAVENOUS | Status: DC
  Administered 2017-09-12 – 2017-09-14 (×4): 3 mL via INTRAVENOUS

## 2017-09-12 MED ORDER — ONDANSETRON HCL 4 MG PO TABS
4.0000 mg | ORAL_TABLET | Freq: Four times a day (QID) | ORAL | Status: DC | PRN
Start: 2017-09-12 — End: 2017-09-14

## 2017-09-12 MED ORDER — GUAIFENESIN ER 600 MG PO TB12: 600.0000 mg | ORAL_TABLET | Freq: Two times a day (BID) | ORAL | Status: DC | PRN

## 2017-09-12 MED ORDER — ALUM & MAG HYDROXIDE-SIMETH 200-200-20 MG/5ML PO SUSP: 15.0000 mL | ORAL | Status: DC | PRN

## 2017-09-12 MED ORDER — CARVEDILOL 25 MG PO TABS
50.0000 mg | ORAL_TABLET | Freq: Two times a day (BID) | ORAL | Status: DC
  Administered 2017-09-12 – 2017-09-13 (×2): 50 mg via ORAL
  Filled 2017-09-12 (×2): qty 2

## 2017-09-12 MED ORDER — BISACODYL 10 MG RE SUPP: 10.0000 mg | Freq: Every day | RECTAL | Status: DC | PRN

## 2017-09-12 MED ORDER — BISACODYL 5 MG PO TBEC: 10.0000 mg | DELAYED_RELEASE_TABLET | Freq: Every day | ORAL | Status: DC | PRN

## 2017-09-12 MED ORDER — ASPIRIN EC 325 MG PO TBEC
325.0000 mg | DELAYED_RELEASE_TABLET | Freq: Every day | ORAL | Status: DC
  Administered 2017-09-13 – 2017-09-14 (×2): 325 mg via ORAL
  Filled 2017-09-12 (×2): qty 1

## 2017-09-12 MED ORDER — ONDANSETRON HCL 4 MG/2ML IJ SOLN: 4.0000 mg | Freq: Four times a day (QID) | INTRAMUSCULAR | Status: DC | PRN

## 2017-09-12 MED ORDER — SODIUM CHLORIDE 0.9 % IV SOLN: 250.0000 mL | INTRAVENOUS | Status: DC | PRN

## 2017-09-12 MED ORDER — OXYCODONE HCL 5 MG PO TABS
5.0000 mg | ORAL_TABLET | ORAL | Status: DC | PRN
  Administered 2017-09-12: 10 mg via ORAL
  Filled 2017-09-12: qty 2

## 2017-09-12 MED ORDER — POTASSIUM CHLORIDE CRYS ER 10 MEQ PO TBCR
10.0000 meq | EXTENDED_RELEASE_TABLET | Freq: Every day | ORAL | Status: DC
  Administered 2017-09-12: 10 meq via ORAL
  Filled 2017-09-12: qty 1

## 2017-09-12 MED ORDER — PANTOPRAZOLE SODIUM 40 MG PO TBEC
40.0000 mg | DELAYED_RELEASE_TABLET | Freq: Every day | ORAL | Status: DC
  Administered 2017-09-13 – 2017-09-14 (×2): 40 mg via ORAL
  Filled 2017-09-12 (×3): qty 1

## 2017-09-12 MED ORDER — INSULIN ASPART 100 UNIT/ML ~~LOC~~ SOLN: 0.0000 [IU] | Freq: Three times a day (TID) | SUBCUTANEOUS | Status: DC

## 2017-09-12 MED ORDER — TRAMADOL HCL 50 MG PO TABS: 50.0000 mg | ORAL_TABLET | ORAL | Status: DC | PRN

## 2017-09-12 MED ORDER — MOVING RIGHT ALONG BOOK
Freq: Once | Status: DC
  Filled 2017-09-12: qty 1

## 2017-09-12 MED ORDER — ACETAMINOPHEN 325 MG PO TABS
650.0000 mg | ORAL_TABLET | Freq: Four times a day (QID) | ORAL | Status: DC | PRN
  Filled 2017-09-12: qty 2

## 2017-09-12 MED ORDER — SODIUM CHLORIDE 0.9% FLUSH: 3.0000 mL | INTRAVENOUS | Status: DC | PRN

## 2017-09-12 MED ORDER — FUROSEMIDE 20 MG PO TABS
20.0000 mg | ORAL_TABLET | Freq: Every day | ORAL | Status: AC
  Administered 2017-09-12 – 2017-09-14 (×3): 20 mg via ORAL
  Filled 2017-09-12 (×4): qty 1

## 2017-09-12 NOTE — Progress Notes (Signed)
Patient ID: Brendan Jackson, male   DOB: 1950/03/15, 67 y.o.   MRN: [id number] TCTS DAILY ICU PROGRESS NOTE                   [address]            [place] [address]          [phone]   3 Days Post-Op Procedure(s) (LRB): CORONARY ARTERY BYPASS GRAFTING (CABG) x 4, USING LEFT INTERNAL MAMMARY ARTERY AND RIGHT AND LEFT GREATER SAPHENOUS VEIN HARVESTED ENDOSCOPICALLY (N/A) TRANSESOPHAGEAL ECHOCARDIOGRAM (TEE) (N/A)  Total Length of Stay:  LOS: 7 days   Subjective: Patient feels well this morning, voiding without difficulty  Objective: Vital signs in last 24 hours: Temp:  [98.5 F (36.9 C)-99.7 F (37.6 C)] 98.8 F (37.1 C) (07/14 0815) Pulse Rate:  [59-77] 69 (07/14 1000) Cardiac Rhythm: Sinus bradycardia;Normal sinus rhythm (07/13 1600) Resp:  [12-33] 30 (07/14 1000) BP: (109-145)/(71-93) 109/75 (07/14 1000) SpO2:  [94 %-99 %] 94 % (07/14 1000) Weight:  [209 lb 7 oz (95 kg)] 209 lb 7 oz (95 kg) (07/14 0500)  Filed Weights   09/10/17 0500 09/11/17 0500 09/12/17 0500  Weight: 216 lb 6.4 oz (98.2 kg) 212 lb 4.9 oz (96.3 kg) 209 lb 7 oz (95 kg)    Weight change: -2 lb 13.9 oz (-1.3 kg)   Hemodynamic parameters for last 24 hours:    Intake/Output from previous day: 07/13 0701 - 07/14 0700 In: 1260 [P.O.:1160; I.V.:100] Out: 2420 [Urine:2250; Chest Tube:170]  Intake/Output this shift: Total I/O In: 120 [P.O.:120] Out: 725 [Urine:725]  Current Meds: Scheduled Meds: . acetaminophen  1,000 mg Oral Q6H   Or  . acetaminophen (TYLENOL) oral liquid 160 mg/5 mL  1,000 mg Per Tube Q6H  . aspirin EC  325 mg Oral Daily   Or  . aspirin  324 mg Per Tube Daily  . atorvastatin  80 mg Oral q1800  . bisacodyl  10 mg Oral Daily   Or  . bisacodyl  10 mg Rectal Daily  . Chlorhexidine Gluconate Cloth  6 each Topical Daily  . docusate sodium  200 mg Oral Daily  . furosemide  20 mg Intravenous BID  . mouth rinse  15 mL Mouth Rinse BID  . metoprolol tartrate   12.5 mg Oral BID   Or  . metoprolol tartrate  12.5 mg Per Tube BID  . mupirocin ointment  1 application Nasal BID  . pantoprazole  40 mg Oral Daily  . sodium chloride flush  3 mL Intravenous Q12H   Continuous Infusions: . sodium chloride Stopped (09/10/17 0923)  . sodium chloride    . sodium chloride 10 mL/hr at 09/09/17 1402  . insulin (NOVOLIN-R) infusion 1.4 mL/hr at 09/10/17 1000  . lactated ringers    . lactated ringers Stopped (09/11/17 1100)  . nitroGLYCERIN Stopped (09/09/17 1856)  . phenylephrine (NEO-SYNEPHRINE) Adult infusion Stopped (09/09/17 2342)   PRN Meds:.sodium chloride, ketorolac, metoprolol tartrate, midazolam, morphine injection, ondansetron (ZOFRAN) IV, oxyCODONE, sodium chloride flush, traMADol  General appearance: alert and cooperative Neurologic: intact Heart: regular rate and rhythm, S1, S2 normal, no murmur, click, rub or gallop Lungs: clear to auscultation bilaterally Abdomen: soft, non-tender; bowel sounds normal; no masses,  no organomegaly Extremities: extremities normal, atraumatic, no cyanosis or edema and Homans sign is negative, no sign of DVT Wound: Sternum stable  Lab Results: CBC: Recent Labs    09/11/17 0317 09/12/17 0548  WBC 10.6* 10.4  HGB  11.5* 11.5*  HCT 34.7* 34.3*  PLT 117* 135*   BMET:  Recent Labs    09/11/17 0317 09/12/17 0548  NA 136 138  K 4.0 3.8  CL 105 104  CO2 26 27  GLUCOSE 112* 109*  BUN 13 16  CREATININE 0.99 0.88  CALCIUM 8.4* 8.5*    CMET: Lab Results  Component Value Date   WBC 10.4 09/12/2017   HGB 11.5 (L) 09/12/2017   HCT 34.3 (L) 09/12/2017   PLT 135 (L) 09/12/2017   GLUCOSE 109 (H) 09/12/2017   CHOL 157 09/06/2017   TRIG 61 09/06/2017   HDL 37 (L) 09/06/2017   LDLCALC 108 (H) 09/06/2017   NA 138 09/12/2017   K 3.8 09/12/2017   CL 104 09/12/2017   CREATININE 0.88 09/12/2017   BUN 16 09/12/2017   CO2 27 09/12/2017   TSH 4.336 09/07/2017   INR 1.43 09/09/2017   HGBA1C 5.4 09/07/2017        PT/INR:  Recent Labs    09/09/17 1351  LABPROT 17.3*  INR 1.43   Radiology: Dg Chest Port 1 View  Result Date: 09/12/2017 CLINICAL DATA:  Chest tube EXAM: PORTABLE CHEST 1 VIEW COMPARISON:  09/11/2017 FINDINGS: Negative for chest tube.  Negative for pneumothorax Right jugular sheath in the right innominate vein unchanged. Left lower lobe atelectasis/infiltrate unchanged. Right lung clear. Negative for heart failure. IMPRESSION: Negative for pneumothorax. No change left lower lobe airspace disease. Electronically Signed   By: Brendan Jackson M.D.   On: 09/12/2017 08:38     Assessment/Plan: S/P Procedure(s) (LRB): CORONARY ARTERY BYPASS GRAFTING (CABG) x 4, USING LEFT INTERNAL MAMMARY ARTERY AND RIGHT AND LEFT GREATER SAPHENOUS VEIN HARVESTED ENDOSCOPICALLY (N/A) TRANSESOPHAGEAL ECHOCARDIOGRAM (TEE) (N/A) Mobilize d/c tubes/lines Plan for transfer to step-down: see transfer orders Renal function stable    Brendan Jackson 09/12/2017 10:35 AM

## 2017-09-12 NOTE — Progress Notes (Signed)
Patient ambulated 470 feet in hall with [place] and standby assist and tolerated well. Sats were 96% on room air upon return to room.

## 2017-09-12 NOTE — Progress Notes (Signed)
Report received from [facility] in [facility] and patient received in stable condition. Telemetry set up and CCMD notified. Skin assessment complete and intact and CHG bath given. Oriented patient and family to room and unit and they deny questions. Patient sitting in chair w multiple family at bedside.

## 2017-09-12 NOTE — Progress Notes (Signed)
[facility] called to give patient report. Nurse in the process of discharging a pt. Phone number left. Will continue to moitor.

## 2017-09-13 ENCOUNTER — Inpatient Hospital Stay (HOSPITAL_COMMUNITY): Payer: 59

## 2017-09-13 DIAGNOSIS — E782 Mixed hyperlipidemia: Secondary | ICD-10-CM

## 2017-09-13 LAB — BASIC METABOLIC PANEL
Anion gap: 9 (ref 5–15)
BUN: 17 mg/dL (ref 8–23)
CO2: 27 mmol/L (ref 22–32)
Calcium: 8.7 mg/dL — ABNORMAL LOW (ref 8.9–10.3)
Chloride: 103 mmol/L (ref 98–111)
Creatinine, Ser: 0.98 mg/dL (ref 0.61–1.24)
GFR calc Af Amer: >60 mL/min (ref >60)
GFR calc non Af Amer: >60 mL/min (ref >60)
Glucose, Bld: 111 mg/dL — ABNORMAL HIGH (ref 70–99)
Potassium: 3.7 mmol/L (ref 3.5–5.1)
Sodium: 139 mmol/L (ref 135–145)

## 2017-09-13 LAB — CBC
HCT: 33.8 % — ABNORMAL LOW (ref 39.0–52.0)
Hemoglobin: 11.4 g/dL — ABNORMAL LOW (ref 13.0–17.0)
MCH: 32.8 pg (ref 26.0–34.0)
MCHC: 33.7 g/dL (ref 30.0–36.0)
MCV: 97.1 fL (ref 78.0–100.0)
Platelets: 171 10*3/uL (ref 150–400)
RBC: 3.48 MIL/uL — ABNORMAL LOW (ref 4.22–5.81)
RDW: 12.9 % (ref 11.5–15.5)
WBC: 9 10*3/uL (ref 4.0–10.5)

## 2017-09-13 MED ORDER — POTASSIUM CHLORIDE CRYS ER 20 MEQ PO TBCR
20.0000 meq | EXTENDED_RELEASE_TABLET | Freq: Every day | ORAL | Status: AC
  Administered 2017-09-13 – 2017-09-14 (×2): 20 meq via ORAL
  Filled 2017-09-13 (×2): qty 1

## 2017-09-13 MED ORDER — SORBITOL 70 % SOLN
60.0000 mL | Freq: Every day | Status: DC | PRN
  Filled 2017-09-13: qty 60

## 2017-09-13 MED ORDER — LISINOPRIL 5 MG PO TABS
5.0000 mg | ORAL_TABLET | Freq: Every day | ORAL | Status: DC
  Administered 2017-09-13 – 2017-09-14 (×2): 5 mg via ORAL
  Filled 2017-09-13 (×2): qty 1

## 2017-09-13 MED ORDER — CARVEDILOL 25 MG PO TABS
25.0000 mg | ORAL_TABLET | Freq: Two times a day (BID) | ORAL | Status: DC
  Filled 2017-09-13 (×2): qty 1

## 2017-09-13 MED ORDER — SORBITOL 70 % PO SOLN: 60.0000 mL | Freq: Every day | ORAL | Status: DC | PRN

## 2017-09-13 MED ORDER — LACTULOSE 10 GM/15ML PO SOLN
20.0000 g | Freq: Every day | ORAL | Status: DC | PRN
  Filled 2017-09-13: qty 30

## 2017-09-13 NOTE — Care Management Note (Signed)
Case Management Note Marvetta Gibbons RN, BSN Unit 4E-Case Manager [phone]  Patient Details  Name: Brendan Jackson MRN: [id number] Date of Birth: Jul 20, 1950  Subjective/Objective:  Pt admitted with NSTEMI s/p CABGx4 09/09/17                  Action/Plan: PTA pt lived at home with wife- anticipate return home. Orders have been placed for HHRN/PT- spoke with pt and wife at bedside- choice offered for [facility] agencies- per choice they would like to use [facility] for [facility] needs. No DME needs noted at this time. Pt is ambulating without RW. Call made to Butch Penny with [facility] for [facility] referral- referral accepted for HHRN/PT- possible d/c 7/16 Verified address and phone #- additional # for Home is [phone]  Expected Discharge Date:  09/07/17               Expected Discharge Plan:  [place]  In-House Referral:  NA  Discharge planning Services  CM Consult  Post Acute Care Choice:  Home Health Choice offered to:  Patient, Spouse  DME Arranged:    DME Agency:     HH Arranged:  RN, PT [place] Agency:  [place]  Status of Service:  Completed, signed off  If discussed at [place] of Stay Meetings, dates discussed:    Discharge Disposition: home/home health   Additional Comments:  Dawayne Patricia, RN 09/13/2017, 2:32 PM

## 2017-09-13 NOTE — Progress Notes (Addendum)
Progress Note  Patient Name: Brendan Jackson Date of Encounter: 09/13/2017  Primary Cardiologist: No primary care provider on file.  Subjective   Feeling well this morning. Planning for discharge home tomorrow.   Inpatient Medications    Scheduled Meds: . aspirin EC  325 mg Oral Daily  . atorvastatin  80 mg Oral q1800  . carvedilol  50 mg Oral BID WC  . Chlorhexidine Gluconate Cloth  6 each Topical Daily  . furosemide  20 mg Oral Daily  . lisinopril  5 mg Oral Daily  . mouth rinse  15 mL Mouth Rinse BID  . moving right along book   Does not apply Once  . mupirocin ointment  1 application Nasal BID  . pantoprazole  40 mg Oral QAC breakfast  . potassium chloride  20 mEq Oral Daily  . sodium chloride flush  3 mL Intravenous Q12H   Continuous Infusions: . sodium chloride     PRN Meds: sodium chloride, acetaminophen, alum & mag hydroxide-simeth, bisacodyl **OR** bisacodyl, guaiFENesin, lactulose, ondansetron **OR** ondansetron (ZOFRAN) IV, oxyCODONE, sodium chloride flush, traMADol   Vital Signs    Vitals:   09/12/17 1418 09/12/17 1936 09/12/17 2143 09/13/17 0330  BP: 128/76 134/70  120/74  Pulse: 79 71 64   Resp: 20 20 18  (!) 22  Temp: 99.1 F (37.3 C) 98.8 F (37.1 C)  99.3 F (37.4 C)  TempSrc: Oral Oral  Oral  SpO2: 95% 95% 95% 94%  Weight:      Height:        Intake/Output Summary (Last 24 hours) at 09/13/2017 0807 Last data filed at 09/12/2017 2100 Gross per 24 hour  Intake 220 ml  Output 1375 ml  Net -1155 ml   Filed Weights   09/10/17 0500 09/11/17 0500 09/12/17 0500  Weight: 216 lb 6.4 oz (98.2 kg) 212 lb 4.9 oz (96.3 kg) 209 lb 7 oz (95 kg)    Telemetry    SR - Personally Reviewed  Physical Exam   General: Well developed, well nourished, older male appearing in no acute distress. Head: Normocephalic, atraumatic.  Neck: Supple, no JVD. Lungs:  Resp regular and unlabored, CTA. Well healing sternotomy incision.  Heart: RRR, S1, S2, no murmur;  no rub. Abdomen: Soft, non-tender, non-distended with normoactive bowel sounds. Extremities: No clubbing, cyanosis, edema. Distal pedal pulses are 2+ bilaterally. Neuro: Alert and oriented X 3. Moves all extremities spontaneously. Psych: Normal affect.  Labs    Chemistry Recent Labs  Lab 09/11/17 0317 09/12/17 0548 09/13/17 0340  NA 136 138 139  K 4.0 3.8 3.7  CL 105 104 103  CO2 26 27 27   GLUCOSE 112* 109* 111*  BUN 13 16 17   CREATININE 0.99 0.88 0.98  CALCIUM 8.4* 8.5* 8.7*  GFRNONAA >60 >60 >60  GFRAA >60 >60 >60  ANIONGAP 5 7 9      Hematology Recent Labs  Lab 09/11/17 0317 09/12/17 0548 09/13/17 0340  WBC 10.6* 10.4 9.0  RBC 3.49* 3.47* 3.48*  HGB 11.5* 11.5* 11.4*  HCT 34.7* 34.3* 33.8*  MCV 99.4 98.8 97.1  MCH 33.0 33.1 32.8  MCHC 33.1 33.5 33.7  RDW 13.2 12.8 12.9  PLT 117* 135* 171    Cardiac Enzymes Recent Labs  Lab 09/06/17 1212  TROPONINI 0.77*   No results for input(s): TROPIPOC in the last 168 hours.   BNPNo results for input(s): BNP, PROBNP in the last 168 hours.   DDimer No results for input(s): DDIMER in the last 168  hours.    Radiology    Dg Chest 2 View  Result Date: 09/13/2017 CLINICAL DATA:  Status post cardiac surgery, postop day 4. EXAM: CHEST - 2 VIEW COMPARISON:  09/12/2017 and older studies. FINDINGS: Cardiac silhouette is size. No mediastinal widening. No mediastinal masses. Left basilar atelectasis has mostly resolved. Lungs otherwise clear. Small pleural effusions. No pneumothorax. All lines and tubes have been removed. IMPRESSION: 1. No acute findings.  No evidence of an operative complication. 2. Left lung base atelectasis had mostly resolved. Small residual pleural effusions. Electronically Signed   By: Lajean Manes M.D.   On: 09/13/2017 07:42   Dg Chest Port 1 View  Result Date: 09/12/2017 CLINICAL DATA:  Chest tube EXAM: PORTABLE CHEST 1 VIEW COMPARISON:  09/11/2017 FINDINGS: Negative for chest tube.  Negative for  pneumothorax Right jugular sheath in the right innominate vein unchanged. Left lower lobe atelectasis/infiltrate unchanged. Right lung clear. Negative for heart failure. IMPRESSION: Negative for pneumothorax. No change left lower lobe airspace disease. Electronically Signed   By: Franchot Gallo M.D.   On: 09/12/2017 08:38    Cardiac Studies   Cath: 09/06/17   Ost LAD to Prox LAD lesion is 95% stenosed.  Ost 1st Mrg lesion is 70% stenosed.  Ost Ramus lesion is 95% stenosed.  Mid RCA lesion is 50% stenosed.  Dist RCA lesion is 95% stenosed.  There is mild to moderate left ventricular systolic dysfunction.  LV end diastolic pressure is normal.  The left ventricular ejection fraction is 45-50% by visual estimate.  IMPRESSION: Mr. Martis has a long high-grade segmental proximal LAD stenosis, ostial/proximal ramus branch stenosis, obtuse marginal branch stenosis and distal dominant RCA stenosis.  He has an anterolateral wall motion abnormality as a result of his proximal LAD and "non-STEMI".  His anatomy is suitable for coronary artery bypass grafting.  The sheath was removed and a TR band was placed on the right wrist to achieve patent hemostasis.  The patient left the lab in stable condition.  Heparin will be restarted in 2 hours without a bolus.  TCT S has been notified.  Quay Burow. MD, [facility]  Patient Profile     67 y.o. male with PMH of HTN, and prostate CA who presented with chest pain and found to have elevated troponins. Underwent cardiac cath noted above with multivessel disease. Seen by TCTS and underwent CABG with Dr. Prescott Gum.  Assessment & Plan    1. CAD s/p CABG: Trop peaked at 0.81. Underwent 4v bypass on 7/11. Has been progressing well. Planned for discharge in the morning. Currently on ASA, statin, BB, and ACEi. Has required lasix post op but CXR improving. Suspect he will not require lasix at the time of discharge.   2. HTN: stable with current therapy.   3. HL:  on high dose statin. Will need FLP/LFTs in 6 weeks.    Signed, Reino Bellis, NP  09/13/2017, 8:07 AM  Pager # [phone]   For questions or updates, please contact [place] Please consult [email or web address] for contact info under Cardiology/STEMI.   I have examined the patient and reviewed assessment and plan and discussed with patient.  Agree with above as stated.  He reports low HR On Coreg 50 mg BID, dropping to the 30s.  WIl decrease Coreg to 25 mg BID.  CARDIOLOGY RECOMMENDATIONS:  Discharge is anticipated in the next 48 hours. Recommendations for medications and follow up:  Discharge Medications: Continue medications as they are currently listed in the [facility]. Exceptions  to the above:  Decrease Coreg to 25 mg BID  Lasix 40 mg daily prn.  He does not appear volume overloaded.  Atorvastatin 80 mg for lipid lowering therapy.   Consider adding PLavix at OP visit.  May need to hold it for prostate surgery in a few months.  Follow Up: The patient's Primary Cardiologist is Sherren Mocha, MD - Will likely switch to a [facility] doctor since the wife works there.  Follow up in the office in 1-2 week(s).  SignedLarae Grooms, MD  11:27 AM 09/13/2017  [place]

## 2017-09-13 NOTE — Progress Notes (Addendum)
      [address]       [place] [address]             [phone]      4 Days Post-Op Procedure(s) (LRB): CORONARY ARTERY BYPASS GRAFTING (CABG) x 4, USING LEFT INTERNAL MAMMARY ARTERY AND RIGHT AND LEFT GREATER SAPHENOUS VEIN HARVESTED ENDOSCOPICALLY (N/A) TRANSESOPHAGEAL ECHOCARDIOGRAM (TEE) (N/A)   Subjective:  Brendan Jackson has no new complaints.  States he is feeling pretty good.  + ambulation  No BM  Objective: Vital signs in last 24 hours: Temp:  [98.8 F (37.1 C)-99.7 F (37.6 C)] 99.3 F (37.4 C) (07/15 0330) Pulse Rate:  [64-84] 64 (07/14 2143) Cardiac Rhythm: Normal sinus rhythm;Bundle branch block (07/15 0708) Resp:  [18-30] 22 (07/15 0330) BP: (105-138)/(67-92) 120/74 (07/15 0330) SpO2:  [92 %-97 %] 94 % (07/15 0330)  Intake/Output from previous day: 07/14 0701 - 07/15 0700 In: 340 [P.O.:340] Out: 1375 [Urine:1375]  General appearance: alert, cooperative and no distress Heart: regular rate and rhythm Lungs: diminished breath sounds on left Abdomen: soft, non-tender; bowel sounds normal; no masses,  no organomegaly Extremities: edema trace Wound: clean and dry, ecchymosis of BLE  Lab Results: Recent Labs    09/12/17 0548 09/13/17 0340  WBC 10.4 9.0  HGB 11.5* 11.4*  HCT 34.3* 33.8*  PLT 135* 171   BMET:  Recent Labs    09/12/17 0548 09/13/17 0340  NA 138 139  K 3.8 3.7  CL 104 103  CO2 27 27  GLUCOSE 109* 111*  BUN 16 17  CREATININE 0.88 0.98  CALCIUM 8.5* 8.7*    PT/INR: No results for input(s): LABPROT, INR in the last 72 hours. ABG    Component Value Date/Time   PHART 7.338 (L) 09/09/2017 1834   HCO3 20.4 09/09/2017 1834   TCO2 22 09/10/2017 1732   ACIDBASEDEF 5.0 (H) 09/09/2017 1834   O2SAT 96.0 09/09/2017 1834   CBG (last 3)  Recent Labs    09/10/17 0804 09/10/17 0922  GLUCAP 89 128*    Assessment/Plan: S/P Procedure(s) (LRB): CORONARY ARTERY BYPASS GRAFTING (CABG) x 4, USING LEFT INTERNAL MAMMARY  ARTERY AND RIGHT AND LEFT GREATER SAPHENOUS VEIN HARVESTED ENDOSCOPICALLY (N/A) TRANSESOPHAGEAL ECHOCARDIOGRAM (TEE) (N/A)  1. CV- Sinus Bradycardia, HTN at times- will continue home Coreg, add lose dose Lisinopril-- d/c EPW today 2. Pulm- no acute issues, continue IS 3. Renal- creatinine WNL, Lasix, will increase potassium supplementation as K is 3.7 4. Expected blood loss anemia, mild Hgb at 11.4 5. Expected post operative Thrombocytopenia, improved up to 171 6. Dispo- patient stable, will d/c EPW today, add ACE for better BP control, likely d/c in AM   LOS: 8 days    Brendan Jackson 09/13/2017 patient examined and medical record reviewed,agree with above note. Brendan Jackson 09/13/2017

## 2017-09-13 NOTE — Progress Notes (Signed)
Pacer wires pulled per provider order. Patient vital signs are stable and he tolerated removal well. Wires were removed without complication and intact, no tissue attached to wires. Removal area is clean dry and intact. Patient will remain on bedrest for 1 hour.  Will continue to monitor patient.

## 2017-09-13 NOTE — Progress Notes (Signed)
CARDIAC REHAB PHASE I   PRE:  Rate/Rhythm: 71 SR  BP:  Supine:   Sitting: 130/81  Standing:    SaO2: 94%RA  MODE:  Ambulation: 470 ft   POST:  Rate/Rhythm: 77 SR  BP:  Supine:   Sitting: 144/77  Standing:    SaO2: 95%RA 0957-1040 Pt walked 470 ft on RA with hand held asst and steady gait. Tolerated well. Education completed with pt and wife who voiced understanding. Reviewed sternal precautions and staying in the tube, IS, ex ed and heart healthy food choices. Discussed CRP 2 and referred to [facility] program. Has seen discharge video.    Graylon Good, RN BSN  09/13/2017 10:35 AM

## 2017-09-14 MED ORDER — ACETAMINOPHEN 325 MG PO TABS: 650.0000 mg | ORAL_TABLET | Freq: Four times a day (QID) | ORAL | Status: DC | PRN

## 2017-09-14 MED ORDER — CARVEDILOL 25 MG PO TABS: 25.0000 mg | ORAL_TABLET | Freq: Two times a day (BID) | ORAL | 3 refills | Status: DC

## 2017-09-14 MED ORDER — ASPIRIN 325 MG PO TBEC: 325.0000 mg | DELAYED_RELEASE_TABLET | Freq: Every day | ORAL | 0 refills | Status: DC

## 2017-09-14 MED ORDER — OXYCODONE HCL 5 MG PO TABS: 5.0000 mg | ORAL_TABLET | ORAL | 0 refills | Status: DC | PRN

## 2017-09-14 MED ORDER — LACTULOSE 10 GM/15ML PO SOLN
20.0000 g | Freq: Once | ORAL | Status: AC
Start: 2017-09-14 — End: 2017-09-14
  Administered 2017-09-14: 20 g via ORAL

## 2017-09-14 MED ORDER — ATORVASTATIN CALCIUM 80 MG PO TABS: 80.0000 mg | ORAL_TABLET | Freq: Every day | ORAL | 3 refills | Status: DC

## 2017-09-14 MED ORDER — LISINOPRIL 5 MG PO TABS: 5.0000 mg | ORAL_TABLET | Freq: Every day | ORAL | 3 refills | Status: DC

## 2017-09-14 MED FILL — LISINOPRIL 5 MG TABLET: 5 | 30 days supply | Qty: 30 | Fill #0

## 2017-09-14 MED FILL — ATORVASTATIN 80 MG TABLET: 80 | 30 days supply | Qty: 30 | Fill #0

## 2017-09-14 NOTE — Progress Notes (Signed)
Patient is ready for discharge. Patient has all of his belongings. Discharge has been explained and all questions regarding discharge have been answered. Patient has been taken off telemetry and CCMD notified. IV has been removed without complication, catheter intact. Patient will be transported home by wife and daughter who are here with him now. He will leave the unit via wheelchair and meet his family at the front entrance of the hospital.

## 2017-09-14 NOTE — Progress Notes (Signed)
PT Cancellation Note  Patient Details Name: Matias Thurman MRN: [id number] DOB: 1950-09-09   Cancelled Treatment:    Reason Eval/Treat Not Completed: PT screened, no needs identified, will sign off. Spoke with pt and wife. Pt is amb well without assistive device. They have stairs to enter home but both pt and wife feel confident that pt will be able to negotiate without difficulty.   [facility] PT [place] 09/14/2017, 9:29 AM

## 2017-09-14 NOTE — Progress Notes (Addendum)
      [address]       [place] [address]             [phone]      5 Days Post-Op Procedure(s) (LRB): CORONARY ARTERY BYPASS GRAFTING (CABG) x 4, USING LEFT INTERNAL MAMMARY ARTERY AND RIGHT AND LEFT GREATER SAPHENOUS VEIN HARVESTED ENDOSCOPICALLY (N/A) TRANSESOPHAGEAL ECHOCARDIOGRAM (TEE) (N/A)   Subjective:  No new complaints.  Feels good and wants to go home.  He has not been able to move his bowels  Objective: Vital signs in last 24 hours: Temp:  [99.2 F (37.3 C)-99.3 F (37.4 C)] 99.3 F (37.4 C) (07/16 0435) Pulse Rate:  [59-63] 59 (07/16 0435) Cardiac Rhythm: Sinus bradycardia;Bundle branch block (07/16 0700) Resp:  [20-26] 20 (07/16 0435) BP: (107-119)/(60-69) 107/60 (07/16 0435) SpO2:  [94 %-95 %] 94 % (07/16 0435) Weight:  [200 lb 12.8 oz (91.1 kg)] 200 lb 12.8 oz (91.1 kg) (07/16 0438)  Intake/Output from previous day: 07/15 0701 - 07/16 0700 In: 720 [P.O.:720] Out: 2125 [Urine:2125]  General appearance: alert, cooperative and no distress Heart: regular rate and rhythm Lungs: clear to auscultation bilaterally Abdomen: soft, non-tender; bowel sounds normal; no masses,  no organomegaly Extremities: edema trace, mild pitting Wound: clean and dry  Lab Results: Recent Labs    09/12/17 0548 09/13/17 0340  WBC 10.4 9.0  HGB 11.5* 11.4*  HCT 34.3* 33.8*  PLT 135* 171   BMET:  Recent Labs    09/12/17 0548 09/13/17 0340  NA 138 139  K 3.8 3.7  CL 104 103  CO2 27 27  GLUCOSE 109* 111*  BUN 16 17  CREATININE 0.88 0.98  CALCIUM 8.5* 8.7*    PT/INR: No results for input(s): LABPROT, INR in the last 72 hours. ABG    Component Value Date/Time   PHART 7.338 (L) 09/09/2017 1834   HCO3 20.4 09/09/2017 1834   TCO2 22 09/10/2017 1732   ACIDBASEDEF 5.0 (H) 09/09/2017 1834   O2SAT 96.0 09/09/2017 1834   CBG (last 3)  No results for input(s): GLUCAP in the last 72 hours.  Assessment/Plan: S/P Procedure(s) (LRB): CORONARY  ARTERY BYPASS GRAFTING (CABG) x 4, USING LEFT INTERNAL MAMMARY ARTERY AND RIGHT AND LEFT GREATER SAPHENOUS VEIN HARVESTED ENDOSCOPICALLY (N/A) TRANSESOPHAGEAL ECHOCARDIOGRAM (TEE) (N/A)  1. CV- Sinus Bradycardia, BP improved- continue Coreg, Lisinopril 2. Pulm- no acute issues, continue IS 3. Renal- weight is below baseline, I/O negative, has completed regimen of Lasix after todays dose 4. GI- constipation, will give Lactulose today, as he did not receive dose yesterday 5. Dispo- patient stable, will give lactulose today, home later if bowels move   LOS: 9 days    Brendan Jackson 09/14/2017 patient examined and medical record reviewed,agree with above note. Tharon Aquas Trigt III 09/14/2017

## 2017-09-14 NOTE — Progress Notes (Signed)
CARDIAC REHAB PHASE I   Pt for d/c today. Pt ambulating in hallways independently. Questions answered re d/c care. No further questions. Pt and wife ready to go. RN made aware.  [phone] Rufina Falco, RN BSN 09/14/2017 11:42 AM

## 2017-09-15 ENCOUNTER — Telehealth (HOSPITAL_COMMUNITY): Payer: Self-pay

## 2017-09-15 NOTE — Telephone Encounter (Signed)
Attempted to call patient in regards to Cardiac Rehab - LM on VM 

## 2017-09-15 NOTE — Telephone Encounter (Signed)
Pt insurance is active and benefits verified through [employer]. Co-pay $0.00, DED $185.00/$185.00 met, out of pocket $0.00/$0.00 met, co-insurance 20%. No pre-authorization required. Passport, 09/15/17 @ 10:45AM, YSA#[id number]  Secondary insurance is active and benefits verified through [facility]. Co-pay $0.00, DED $2,800.00/$1,871043 met, out of pocket $4,000.00/$3,294.99 met, co-insurance 20%. No pre-authorization required. UMR/[place], 09/15/17 @ 11:02AM, [phone]   Will contact patient to see if he is interested in the Cardiac Rehab Program. If interested, patient will need to complete follow up appt. Once completed, patient will be contacted for scheduling upon review by the RN Navigator.

## 2017-09-16 ENCOUNTER — Other Ambulatory Visit: Payer: Self-pay | Admitting: *Deleted

## 2017-09-16 NOTE — Patient Outreach (Signed)
[facility]) Care Management  09/16/2017  Brendan Jackson 10-Sep-1950 [id number]   Subjective: Telephone call to patient's home  / mobile number, no answer, left HIPAA compliant voicemail message, and requested call back.     Objective: Per KPN (Knowledge Performance Now, point of care tool) and chart review, patient hospitalized 09/05/17 -09/14/17 for NSTEMI (non-ST elevated myocardial infarction, Coronary artery disease, status post Coronary artery bypass grafting x4 (left internal mammary artery to left anterior descending , saphenous vein graft to ramus intermedius, saphenous vein graft to obtuse marginal, saphenous vein graft to posterior descending), and Bilateral leg saphenous vein endoscopic harvest on 09/09/17.  Patient also has a history of hypertension and prostate cancer.      Assessment: Received UMR Transition of care referral on 09/06/17.   Transition of care follow up pending patient contact.     Plan: RNCM will send unsuccessful outreach  letter, [facility] pamphlet, will call patient for 2nd telephone outreach attempt, transition of care follow up, and proceed with case closure, within 10 business days if no return call.      Raihana Balderrama H. Annia Friendly, BSN, [place] Management [facility] Telephonic CM Phone: [phone] Fax: [phone]

## 2017-09-17 ENCOUNTER — Telehealth: Payer: Self-pay | Admitting: Cardiovascular Disease

## 2017-09-17 ENCOUNTER — Other Ambulatory Visit: Payer: Self-pay | Admitting: *Deleted

## 2017-09-17 NOTE — Patient Outreach (Signed)
[place] [facility]) Care Management  09/17/2017  Brendan Jackson [birth year] [id number]   Subjective: Telephone call to patient's home  / mobile number, no answer, left HIPAA compliant voicemail message, and requested call back.     Objective: Per KPN (Knowledge Performance Now, point of care tool) and chart review, patient hospitalized 09/05/17 -09/14/17 for NSTEMI (non-ST elevated myocardial infarction, Coronary artery disease, status post Coronary artery bypass grafting x4 (left internal mammary artery to left anterior descending , saphenous vein graft to ramus intermedius, saphenous vein graft to obtuse marginal, saphenous vein graft to posterior descending), and Bilateral leg saphenous vein endoscopic harvest on 09/09/17.  Patient also has a history of hypertension and prostate cancer.      Assessment: Received UMR Transition of care referral on 09/06/17.   Transition of care follow up pending patient contact.     Plan: RNCM has sent unsuccessful outreach  letter, [facility] pamphlet, will call patient for 3rd telephone outreach attempt, transition of care follow up, and proceed with case closure, within 10 business days if no return call.     Brendan Jackson H. Annia Friendly, BSN, [place] Management [facility] Telephonic CM Phone: [phone] Fax: [phone]

## 2017-09-17 NOTE — Telephone Encounter (Signed)
Patient was discharged from the hospital earlier this week after CABG X 4 last Thursday 09/09/17.  Was told he would be given a prescription for Lasix.  He did not receive the RX and his ankle are swelling---does not want this to become worse over the weekend.

## 2017-09-29 ENCOUNTER — Encounter: Payer: Self-pay | Admitting: *Deleted

## 2017-09-29 ENCOUNTER — Other Ambulatory Visit: Payer: Self-pay | Admitting: *Deleted

## 2017-09-29 NOTE — Patient Outreach (Signed)
[place] [facility]) Care Management  09/29/2017  Brendan Jackson December 13, 1950 [id number]   Subjective: Telephone call to patient's home / mobile number, spoke with patient, and HIPAA verified.  Discussed [facility] Care Management UMR Transition of care follow up, patient voiced understanding, and is in agreement to follow up.   Patient states he is doing well, completed home health services, has a follow up appointment with cardiologist on 09/30/17, and with surgeon on 10/18/17.   Patient states he is able to manage self care and has assistance as needed.   Patient voices understanding of medical diagnosis, surgery, and treatment plan. States he is accessing the following [facility] benefits: outpatient pharmacy, hospital indemnity (not sure if chosen, will ask wife who is the [name] to follow up, will file claim if appropriate), wife has family medical leave act (FMLA) in place, and patient states he does not work.  Patient states he does not have any education material, transition of care, care coordination, disease management, disease monitoring, transportation, community resource, or pharmacy needs at this time.  States he is very appreciative of the follow up and is in agreement to receive [place] Management information.    Objective:Per KPN (Knowledge Performance Now, point of care tool) and chart review,patient hospitalized 09/05/17 -09/14/17 forNSTEMI (non-ST elevated myocardial infarction,Coronary artery disease, status postCoronary artery bypass grafting x4 (left internal mammary artery to left anterior descending , saphenous vein graft to ramus intermedius, saphenous vein graft to obtuse marginal, saphenous vein graft to posterior descending), andBilateral leg saphenous vein endoscopic harveston 09/09/17. Patient also has a history of hypertension and prostate cancer.     Assessment: Received UMR Transition of care referral on 09/06/17.Transition of care follow up completed, no  care management needs, and will proceed with case closure.     Plan:RNCM will send patient successful outreach letter, [facility] pamphlet, and magnet. RNCM will complete case closure due to follow up completed / no care management needs.      Brendan Jackson H. Annia Friendly, BSN, [place] Management [facility] Telephonic CM Phone: [phone] Fax: [phone]

## 2017-09-30 ENCOUNTER — Ambulatory Visit (INDEPENDENT_AMBULATORY_CARE_PROVIDER_SITE_OTHER): Payer: 59 | Admitting: Physician Assistant

## 2017-09-30 ENCOUNTER — Encounter: Payer: Self-pay | Admitting: Physician Assistant

## 2017-09-30 VITALS — BP 132/78 | HR 71 | Ht 68.0 in | Wt 207.4 lb

## 2017-09-30 DIAGNOSIS — I10 Essential (primary) hypertension: Secondary | ICD-10-CM

## 2017-09-30 DIAGNOSIS — R001 Bradycardia, unspecified: Secondary | ICD-10-CM

## 2017-09-30 DIAGNOSIS — E785 Hyperlipidemia, unspecified: Secondary | ICD-10-CM

## 2017-09-30 DIAGNOSIS — Z951 Presence of aortocoronary bypass graft: Secondary | ICD-10-CM | POA: Diagnosis not present

## 2017-09-30 DIAGNOSIS — I2510 Atherosclerotic heart disease of native coronary artery without angina pectoris: Secondary | ICD-10-CM

## 2017-09-30 MED ORDER — CARVEDILOL 3.125 MG PO TABS: 3.1250 mg | ORAL_TABLET | Freq: Two times a day (BID) | ORAL | 0 refills | Status: DC

## 2017-09-30 MED FILL — CARVEDILOL 3.125 MG TABLET: 3.125 | 90 days supply | Qty: 180 | Fill #0

## 2017-09-30 NOTE — Progress Notes (Signed)
Cardiology Office Note   Date:  09/30/2017   ID:  Brendan Jackson, DOB 07/19/1950, MRN [id number]  PCP:  Algis Greenhouse, MD  Cardiologist: Dr. Burt Knack saw 09/10/2017 in hospital, pt preference is Dr Sallyanne Kuster Rosaria Ferries, PA-C    History of Present Illness: Brendan Jackson is a 67 y.o. male with a history of HTN, prostate CA  Admitted 7/7-7/16/2019 for non-STEMI>>CABG x 4 w/ LIMA-LAD, SVG-RI, SVG-OM, SVG-PDA, mild thrombocytopenia and postop anemia  Brendan Jackson presents for cardiology follow up.   On Coreg 50 mg bid prior to admission. HR dropped to 38>>held. Tried on 3.125 mg bid, HR still low at times. Given rx for 25 mg bid at d/c, has not taken it.   He is doing a lot of walking, not having any problems w/ that.   No CP or SOB, no orthopnea or PND. Had some LE edema but that has improved.   Is getting restless but accepts the fact that he cannot drive or lift anything till cleared by the surgeons.  He still needs prostate cancer surgery, is not sure how long he will now have to wait for that..   Past Medical History:  Diagnosis Date  . Hyperlipidemia LDL goal <70   . Hypertension   . NSTEMI (non-ST elevated myocardial infarction) ([place]) 09/05/2017  . Prostate cancer [facility])     Past Surgical History:  Procedure Laterality Date  . CATARACT EXTRACTION  2015  . CORONARY ARTERY BYPASS GRAFT N/A 09/09/2017   Procedure: CORONARY ARTERY BYPASS GRAFTING (CABG) x 4, USING LEFT INTERNAL MAMMARY ARTERY AND RIGHT AND LEFT GREATER SAPHENOUS VEIN HARVESTED ENDOSCOPICALLY;  Surgeon: Ivin Poot, MD;  Location: [place];  Service: Open Heart Surgery;  Laterality: N/A;  . LEFT HEART CATH AND CORONARY ANGIOGRAPHY N/A 09/06/2017   Procedure: LEFT HEART CATH AND CORONARY ANGIOGRAPHY;  Surgeon: Lorretta Harp, MD;  Location: [place] CV LAB;  Service: Cardiovascular;  Laterality: N/A;  . PROSTATE BIOPSY  05/13/2017  . TEE WITHOUT CARDIOVERSION N/A 09/09/2017   Procedure:  TRANSESOPHAGEAL ECHOCARDIOGRAM (TEE);  Surgeon: Prescott Gum, Collier Salina, MD;  Location: [place];  Service: Open Heart Surgery;  Laterality: N/A;  . TENDON REPAIR Right 1987   Thumb  . TONSILLECTOMY      Current Outpatient Medications  Medication Sig Dispense Refill  . acetaminophen (TYLENOL) 325 MG tablet Take 2 tablets (650 mg total) by mouth every 6 (six) hours as needed for mild pain.    Marland Kitchen aspirin EC 325 MG EC tablet Take 1 tablet (325 mg total) by mouth daily. 30 tablet 0  . atorvastatin (LIPITOR) 80 MG tablet Take 1 tablet (80 mg total) by mouth daily at 6 PM. 30 tablet 3  . lisinopril (PRINIVIL,ZESTRIL) 5 MG tablet Take 1 tablet (5 mg total) by mouth daily. 30 tablet 3  . carvedilol (COREG) 25 MG tablet Take 1 tablet (25 mg total) by mouth 2 (two) times daily with a meal. (Patient not taking: Reported on 09/29/2017) 60 tablet 3   No current facility-administered medications for this visit.     Allergies:   Patient has no known allergies.    Social History:  The patient  reports that he has never smoked. He quit smokeless tobacco use about 4 years ago. His smokeless tobacco use included chew. He reports that he drinks about 0.6 oz of alcohol per week. He reports that he does not use drugs.   Family History:  The patient's family history includes Cancer in  his father, paternal aunt, paternal aunt, paternal uncle, and paternal uncle.    ROS:  Please see the history of present illness. All other systems are reviewed and negative.    PHYSICAL EXAM: VS:  BP 132/78   Pulse 71   Ht 5\' 8"  (1.727 m)   Wt 207 lb 6.4 oz (94.1 kg)   BMI 31.54 kg/m  , BMI Body mass index is 31.54 kg/m. GEN: Well nourished, well developed, male in no acute distress  HEENT: normal for age  Neck: no JVD, no carotid bruit, no masses Cardiac: RRR; soft murmur, no rubs, or gallops Respiratory: Few rales bases bilaterally, normal work of breathing GI: soft, nontender, nondistended, + BS MS: no deformity or atrophy;  no edema; distal pulses are 2+ in all 4 extremities; where his veins were taken out of his legs, there is a hardened area.  However, this area is not tender to palpation and they say that it is a little smaller than it was at first.  He is to keep an eye on this.  Ecchymosis that was in that area has improved. Skin: warm and dry, no rash Neuro:  Strength and sensation are intact Psych: euthymic mood, full affect   EKG:  EKG is ordered today. 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   Recent Labs: 09/07/2017: TSH 4.336 09/10/2017: Magnesium 2.2 09/13/2017: BUN 17; Creatinine, Ser 0.98; Hemoglobin 11.4; Platelets 171; Potassium 3.7; Sodium 139    Lipid Panel    Component Value Date/Time   CHOL 157 09/06/2017 0218   TRIG 61 09/06/2017 0218   HDL 37 (L) 09/06/2017 0218   CHOLHDL 4.2 09/06/2017 0218   VLDL 12 09/06/2017 0218   LDLCALC 108 (H) 09/06/2017 0218     Wt Readings from Last 3 Encounters:  09/30/17 207 lb 6.4 oz (94.1 kg)  09/14/17 200 lb 12.8 oz (91.1 kg)  08/31/17 212 lb 4 oz (96.3 kg)     Other studies Reviewed: Additional studies/ records that were reviewed today include: Office notes, hospital records and testing.  ASSESSMENT AND PLAN:  1.  NSTEMI s/p CABG: He is compliant with activity restrictions and is recovering well.  He is encouraged to continue this.  I explained to him that not following activity restrictions would merely increase his pain. -Continue full-strength aspirin, statin and ACE inhibitor.  2.  Bradycardia: He had problems with bradycardia in the hospital.  He was supposed to restart carvedilol 25 mg daily but he has not been taking this as his wife is afraid his heart rate would be too low.  That is a legitimate concern. -I will try starting the carvedilol back at 3.125 mg twice daily. -I explained the difference between the low heart rate, and symptomatic bradycardia. -They understand that if  his heart rate does not tolerate this, then he just cannot be on a beta-blocker.  He is willing to try it and see how he tolerates it.  3.  Hyperlipidemia: He is doing better from a dietary standpoint.  Continue statin and recheck lipid and liver in 6 weeks.  4.  Hypertension: They understand that goal blood pressure is 130/80, they will keep an eye on it and advise if he is running consistently higher than that.   Current medicines are reviewed at length with the patient today.  The patient has concerns regarding medicines.  Concerns were addressed  The following changes have been made: Add carvedilol 3.125 mg  twice daily and follow blood pressure/heart rate  Labs/ tests ordered today include:   Orders Placed This Encounter  Procedures  . Comprehensive metabolic panel  . Lipid panel  . EKG 12-Lead     Disposition:   FU with Dr. Sallyanne Kuster  Signed, Rosaria Ferries, PA-C  09/30/2017 1:14 PM    [place] Phone: [phone]; Fax: [phone]  This note was written with the assistance of speech recognition software. Please excuse any transcriptional errors.

## 2017-09-30 NOTE — Patient Instructions (Addendum)
Medication Instructions:  Your physician has recommended you make the following change in your medication:   START: carvedilol 3.125 mg tablet: Take 1 tablet twice a day  Labwork: Your physician recommends that you return for a FASTING lipid profile and complete metabolic panel on September 3rd   Testing/Procedures: None ordered  Follow-Up: Keep follow-up appointment with TCTS on 10/18/17 at 1:30 PM  Your physician recommends that you  follow-up with Dr. Sallyanne Kuster on 11/04/17 at 8:20 AM.   Any Other Special Instructions Will Be Listed Below (If Applicable).  NO DRIVING OR LIFTING ANYTHING HEAVY UNTIL CLEARED BY TCTS  ASK TCTS: 1. HOW LONG YOU NEED TO STAY ON ASPIRIN 325 MG 2. WHEN YOU CAN HAVE YOUR PROSTATE SURGERY   If you need a refill on your cardiac medications before your next appointment, please call your pharmacy.

## 2017-10-01 ENCOUNTER — Telehealth (HOSPITAL_COMMUNITY): Payer: Self-pay

## 2017-10-01 NOTE — Telephone Encounter (Signed)
Called patient in regards to Cardiac Rehab. Patient stated he is interested. Scheduled orientation on 11/11/17 at 8:30am. Patient will attend the 11:15am exc class as of now. Patient stated he is unsure of for sure [place] time [place] Mailed packet.

## 2017-10-11 MED FILL — LISINOPRIL 5 MG TABLET: 5 | 30 days supply | Qty: 30 | Fill #1

## 2017-10-11 MED FILL — ATORVASTATIN 80 MG TABLET: 80 | 30 days supply | Qty: 30 | Fill #1

## 2017-10-18 ENCOUNTER — Ambulatory Visit
Admission: RE | Admit: 2017-10-18 | Discharge: 2017-10-18 | Disposition: A | Discharge status: Home / Self Care | Payer: 59 | Source: Ambulatory Visit | Attending: Cardiothoracic Surgery | Admitting: Cardiothoracic Surgery

## 2017-10-18 ENCOUNTER — Other Ambulatory Visit: Payer: Self-pay | Admitting: Cardiothoracic Surgery

## 2017-10-18 ENCOUNTER — Other Ambulatory Visit: Payer: Self-pay

## 2017-10-18 ENCOUNTER — Ambulatory Visit (INDEPENDENT_AMBULATORY_CARE_PROVIDER_SITE_OTHER): Payer: Self-pay | Admitting: Physician Assistant

## 2017-10-18 VITALS — BP 133/80 | HR 75 | Resp 16 | Ht 68.0 in | Wt 205.0 lb

## 2017-10-18 DIAGNOSIS — Z951 Presence of aortocoronary bypass graft: Secondary | ICD-10-CM

## 2017-10-18 DIAGNOSIS — J90 Pleural effusion, not elsewhere classified: Secondary | ICD-10-CM

## 2017-10-18 DIAGNOSIS — I214 Non-ST elevation (NSTEMI) myocardial infarction: Secondary | ICD-10-CM

## 2017-10-18 MED ORDER — FUROSEMIDE 40 MG PO TABS: 40.0000 mg | ORAL_TABLET | Freq: Every day | ORAL | 1 refills | Status: DC

## 2017-10-18 MED FILL — FUROSEMIDE 40 MG TAB: 40 | 30 days supply | Qty: 30 | Fill #0

## 2017-10-18 NOTE — Progress Notes (Signed)
HPI:  Patient returns for routine postoperative follow-up having undergone CABG x 4  on 09/09/2017. The patient's early postoperative recovery while in the hospital was unremarkable.  Since hospital discharge the patient reports he is doing okay.  They did not start BB at discharge as they felt the dose was too high.  This has been started by Cardiology and patient is taking current dose without issue.  He states that he is walking without issue, when he can.  He feels like his shortness of breath has not improved since surgery.  His weight has been stable and he denies lower extremity edema.  He is going to do cardiac rehab until he has his prostate surgery, they was rescheduled due to his bypass surgery.  Current Outpatient Medications  Medication Sig Dispense Refill  . acetaminophen (TYLENOL) 325 MG tablet Take 2 tablets (650 mg total) by mouth every 6 (six) hours as needed for mild pain.    Marland Kitchen aspirin EC 325 MG EC tablet Take 1 tablet (325 mg total) by mouth daily. 30 tablet 0  . atorvastatin (LIPITOR) 80 MG tablet Take 1 tablet (80 mg total) by mouth daily at 6 PM. 30 tablet 3  . carvedilol (COREG) 3.125 MG tablet Take 1 tablet (3.125 mg total) by mouth 2 (two) times daily with a meal. 180 tablet 0  . lisinopril (PRINIVIL,ZESTRIL) 5 MG tablet Take 1 tablet (5 mg total) by mouth daily. 30 tablet 3  . furosemide (LASIX) 40 MG tablet Take 1 tablet (40 mg total) by mouth daily. 30 tablet 1   No current facility-administered medications for this visit.     Physical Exam:  BP 133/80 (BP Location: Left Arm, Patient Position: Sitting, Cuff Size: Large)   Pulse 75   Resp 16   Ht 5\' 8"  (1.727 m)   Wt 205 lb (93 kg)   SpO2 94% Comment: ON RA  BMI 31.17 kg/m   Gen: no apparent distress Heart: RRR Lungs: Diminished on left Abd: soft non-tender, non-distended Ext: no significant edema, mild pitting on R Incisions: well healed, no evidence of infection  Diagnostic Tests:  CXR: worsening left  pleural effusion, sternal wires intact, no pneumothorax  A/P:  1. S/P CABG x 4- Coreg has been restarted at 3.125 mg BID and patient is taking without difficulty, remains on Lisinopril and BP is controlled 2. Left pleural effusion- will start Lasix 40 mg daily, plan to continue until next appointment with Cardiology... Will refer to IR for Thoracentesis 3. Activity- given instructions to resume driving, okay to proceed with prostate surgery, start cardiac rehab, instructed to continue lifting restrictions of 10-15 lbs for the next several weeks 4. RTC in 2-3 months with PVT with repeat CXR  Ellwood Handler, PA-C [facility] [phone]

## 2017-10-22 ENCOUNTER — Ambulatory Visit (HOSPITAL_COMMUNITY)
Admission: RE | Admit: 2017-10-22 | Discharge: 2017-10-22 | Disposition: A | Discharge status: Home / Self Care | Payer: 59 | Source: Ambulatory Visit | Attending: Physician Assistant | Admitting: Physician Assistant

## 2017-10-22 ENCOUNTER — Encounter (HOSPITAL_COMMUNITY): Payer: Self-pay | Admitting: Student

## 2017-10-22 ENCOUNTER — Ambulatory Visit (HOSPITAL_COMMUNITY)
Admission: RE | Admit: 2017-10-22 | Discharge: 2017-10-22 | Disposition: A | Discharge status: Home / Self Care | Payer: 59 | Source: Ambulatory Visit | Attending: Student | Admitting: Student

## 2017-10-22 ENCOUNTER — Other Ambulatory Visit (HOSPITAL_COMMUNITY): Payer: Self-pay | Admitting: Student

## 2017-10-22 DIAGNOSIS — J90 Pleural effusion, not elsewhere classified: Secondary | ICD-10-CM

## 2017-10-22 DIAGNOSIS — Z951 Presence of aortocoronary bypass graft: Secondary | ICD-10-CM | POA: Insufficient documentation

## 2017-10-22 HISTORY — PX: IR THORACENTESIS ASP PLEURAL SPACE W/IMG GUIDE: IMG5380

## 2017-10-22 MED ORDER — LIDOCAINE HCL (PF) 2 % IJ SOLN
INTRAMUSCULAR | Status: AC
  Filled 2017-10-22: qty 20

## 2017-10-22 MED ORDER — LIDOCAINE HCL (PF) 2 % IJ SOLN
INTRAMUSCULAR | Status: DC | PRN
  Administered 2017-10-22: 10 mL

## 2017-10-22 NOTE — Procedures (Signed)
PROCEDURE SUMMARY:  Successful US guided therapeutic left thoracentesis. Yielded 750 mL of amber fluid. Pt tolerated procedure well. No immediate complications.  Specimen was not sent for labs. CXR ordered.  Docia Barrier PA-C 10/22/2017 1:54 PM

## 2017-10-28 ENCOUNTER — Other Ambulatory Visit: Payer: Self-pay | Admitting: *Deleted

## 2017-10-28 DIAGNOSIS — Z951 Presence of aortocoronary bypass graft: Secondary | ICD-10-CM | POA: Diagnosis not present

## 2017-10-29 LAB — LIPID PANEL
Chol/HDL Ratio: 2.9 ratio (ref 0.0–5.0)
Cholesterol, Total: 125 mg/dL (ref 100–199)
HDL: 43 mg/dL (ref >39)
LDL CALC: 72 mg/dL (ref 0–99)
TRIGLYCERIDES: 48 mg/dL (ref 0–149)
VLDL CHOLESTEROL CAL: 10 mg/dL (ref 5–40)

## 2017-10-29 LAB — COMPREHENSIVE METABOLIC PANEL
A/G RATIO: 1.5 (ref 1.2–2.2)
ALK PHOS: 101 IU/L (ref 39–117)
ALT: 20 IU/L (ref 0–44)
AST: 22 IU/L (ref 0–40)
Albumin: 4 g/dL (ref 3.6–4.8)
BILIRUBIN TOTAL: 0.4 mg/dL (ref 0.0–1.2)
BUN/Creatinine Ratio: 15 (ref 10–24)
BUN: 14 mg/dL (ref 8–27)
CHLORIDE: 101 mmol/L (ref 96–106)
CO2: 25 mmol/L (ref 20–29)
Calcium: 9.5 mg/dL (ref 8.6–10.2)
Creatinine, Ser: 0.96 mg/dL (ref 0.76–1.27)
GFR calc non Af Amer: 83 mL/min/{1.73_m2} (ref >59)
GFR, EST AFRICAN AMERICAN: 95 mL/min/{1.73_m2} (ref >59)
Globulin, Total: 2.7 g/dL (ref 1.5–4.5)
Glucose: 99 mg/dL (ref 65–99)
POTASSIUM: 4.7 mmol/L (ref 3.5–5.2)
Sodium: 141 mmol/L (ref 134–144)
Total Protein: 6.7 g/dL (ref 6.0–8.5)

## 2017-11-03 ENCOUNTER — Telehealth (HOSPITAL_COMMUNITY): Payer: Self-pay

## 2017-11-04 ENCOUNTER — Telehealth (HOSPITAL_COMMUNITY): Payer: Self-pay

## 2017-11-04 ENCOUNTER — Encounter: Payer: Self-pay | Admitting: Cardiovascular Disease

## 2017-11-04 ENCOUNTER — Telehealth (HOSPITAL_COMMUNITY): Payer: Self-pay | Admitting: Family Medicine

## 2017-11-04 ENCOUNTER — Ambulatory Visit (INDEPENDENT_AMBULATORY_CARE_PROVIDER_SITE_OTHER): Payer: 59 | Admitting: Cardiovascular Disease

## 2017-11-04 VITALS — BP 152/90 | HR 75 | Ht 68.0 in | Wt 215.2 lb

## 2017-11-04 DIAGNOSIS — Z01810 Encounter for preprocedural cardiovascular examination: Secondary | ICD-10-CM | POA: Diagnosis not present

## 2017-11-04 DIAGNOSIS — E7800 Pure hypercholesterolemia, unspecified: Secondary | ICD-10-CM | POA: Insufficient documentation

## 2017-11-04 DIAGNOSIS — I2510 Atherosclerotic heart disease of native coronary artery without angina pectoris: Secondary | ICD-10-CM | POA: Diagnosis not present

## 2017-11-04 DIAGNOSIS — I10 Essential (primary) hypertension: Secondary | ICD-10-CM

## 2017-11-04 DIAGNOSIS — E669 Obesity, unspecified: Secondary | ICD-10-CM | POA: Diagnosis not present

## 2017-11-04 NOTE — Telephone Encounter (Signed)
Received telephone note from [facility] stating patient called and cancelled all appts for CR. He stated he saw his cardiologist and his Dr told him he does not need Cr. Cancelled appts.

## 2017-11-04 NOTE — Telephone Encounter (Signed)
Cardiac Rehab - Pharmacy Resident Documentation   Patient unable to be reached after first call attempt, now no longer showing up on schedule for 9/12.  Please complete allergy verification and medication review prior to appointment if rescheduled or during patient's cardiac rehab appointment.   Harrietta Guardian, PharmD PGY1 Pharmacy Resident Direct Phone [phone] 11/04/2017    1:30 PM

## 2017-11-04 NOTE — Progress Notes (Signed)
Cardiology Office Note:    Date:  11/04/2017   ID:  Candace Ramus, DOB 06/11/1950, MRN [id number]  PCP:  Algis Greenhouse, MD  Cardiologist:  Sherren Mocha, MD   Referring MD: Algis Greenhouse, MD   Chief Complaint  Patient presents with  . Follow-up    pt denied chest pain  CAD status post CABG  History of Present Illness:    Brendan Jackson is a 67 y.o. male with a hx of multivessel coronary artery disease presenting with non-STEMI in July 2019, leading to four-vessel bypass surgery (LIMA to LAD, SVG to ramus intermedius, SVG to OM, SVG to PDA, Dr. Prescott Gum), also with a history of systemic hypertension and prostate cancer.  He did well with post bypass recovery but did require thoracentesis for a moderately large left pleural effusion on August 19.  He is no longer taking daily diuretics. He did not have atrial fibrillation.  Preoperative LVEF was mildly depressed at 45-50% with wall motion abnormality in the distal LAD distribution.Marland Kitchen  He feels well and has returned to working on his harm, almost without any restrictions.  He actually dug a ditch this week.  He still does not have his full stamina back, but otherwise has no complaints.  He denies angina or exertional dyspnea.  Leg edema has resolved.  He is eager to follow-up with his urologist Dr. Tresa Moore and schedule planned radical prostatectomy.  Past Medical History:  Diagnosis Date  . Hyperlipidemia LDL goal <70   . Hypertension   . NSTEMI (non-ST elevated myocardial infarction) ([place]) 09/05/2017  . Prostate cancer [facility])     Past Surgical History:  Procedure Laterality Date  . CATARACT EXTRACTION  2015  . CORONARY ARTERY BYPASS GRAFT N/A 09/09/2017   Procedure: CORONARY ARTERY BYPASS GRAFTING (CABG) x 4, USING LEFT INTERNAL MAMMARY ARTERY AND RIGHT AND LEFT GREATER SAPHENOUS VEIN HARVESTED ENDOSCOPICALLY;  Surgeon: Ivin Poot, MD;  Location: [place];  Service: Open Heart Surgery;  Laterality: N/A;  . IR THORACENTESIS  ASP PLEURAL SPACE W/IMG GUIDE  10/22/2017  . LEFT HEART CATH AND CORONARY ANGIOGRAPHY N/A 09/06/2017   Procedure: LEFT HEART CATH AND CORONARY ANGIOGRAPHY;  Surgeon: Lorretta Harp, MD;  Location: [place] CV [facility];  Service: Cardiovascular;  Laterality: N/A;  . PROSTATE BIOPSY  05/13/2017  . TEE WITHOUT CARDIOVERSION N/A 09/09/2017   Procedure: TRANSESOPHAGEAL ECHOCARDIOGRAM (TEE);  Surgeon: Prescott Gum, Collier Salina, MD;  Location: [place];  Service: Open Heart Surgery;  Laterality: N/A;  . TENDON REPAIR Right 1987   Thumb  . TONSILLECTOMY      Current Medications: Current Meds  Medication Sig  . acetaminophen (TYLENOL) 325 MG tablet Take 2 tablets (650 mg total) by mouth every 6 (six) hours as needed for mild pain.  Marland Kitchen aspirin EC 325 MG EC tablet Take 1 tablet (325 mg total) by mouth daily.  Marland Kitchen atorvastatin (LIPITOR) 80 MG tablet Take 1 tablet (80 mg total) by mouth daily at 6 PM.  . carvedilol (COREG) 3.125 MG tablet Take 1 tablet (3.125 mg total) by mouth 2 (two) times daily with a meal.  . furosemide (LASIX) 40 MG tablet Take 1 tablet (40 mg total) by mouth daily.  Marland Kitchen lisinopril (PRINIVIL,ZESTRIL) 5 MG tablet Take 1 tablet (5 mg total) by mouth daily.     Allergies:   Patient has no known allergies.   Social History   Socioeconomic History  . Marital status: Married    Spouse name: [name]  . Number  of children: 1  . Years of education: Not on file  . Highest education level: Not on file  Occupational History    Comment: chicken and cattle farmer  Social Needs  . Financial resource strain: Not on file  . Food insecurity:    Worry: Not on file    Inability: Not on file  . Transportation needs:    Medical: Not on file    Non-medical: Not on file  Tobacco Use  . Smoking status: Never Smoker  . Smokeless tobacco: Former Systems developer    Types: Chew  Substance and Sexual Activity  . Alcohol use: Yes    Alcohol/week: 1.0 standard drinks    Types: 1 Cans of beer per week    Frequency: Never      Comment: occas  . Drug use: Never  . Sexual activity: Yes  Lifestyle  . Physical activity:    Days per week: Not on file    Minutes per session: Not on file  . Stress: Not on file  Relationships  . Social connections:    Talks on phone: Not on file    Gets together: Not on file    Attends religious service: Not on file    Active member of club or organization: Not on file    Attends meetings of clubs or organizations: Not on file    Relationship status: Not on file  Other Topics Concern  . Not on file  Social History Narrative   Resides in [place] with his wife, grown daughter and dogs, Margaretmary [place], [place] and [place].      Family History: The patient's family history includes Cancer in his father, paternal aunt, paternal aunt, paternal uncle, and paternal uncle.  ROS:   Please see the history of present illness.     All other systems reviewed and are negative.  EKGs/Labs/Other Studies Reviewed:    The following studies were reviewed today: Coronary angiography July 2019; transthoracic echo July 9 and TEE July 11  EKG:  EKG is not ordered today.  The ekg ordered 10/10/17 demonstrates sinus rhythm, chronic right bundle branch block  Recent Labs: 09/07/2017: TSH 4.336 09/10/2017: Magnesium 2.2 09/13/2017: Hemoglobin 11.4; Platelets 171 10/28/2017: ALT 20; BUN 14; Creatinine, Ser 0.96; Potassium 4.7; Sodium 141  Recent Lipid Panel    Component Value Date/Time   CHOL 125 10/28/2017 0835   TRIG 48 10/28/2017 0835   HDL 43 10/28/2017 0835   CHOLHDL 2.9 10/28/2017 0835   CHOLHDL 4.2 09/06/2017 0218   VLDL 12 09/06/2017 0218   LDLCALC 72 10/28/2017 0835    Physical Exam:    VS:  BP (!) 152/90   Pulse 75   Ht 5\' 8"  (1.727 m)   Wt 215 lb 3.2 oz (97.6 kg)   SpO2 95%   BMI 32.72 kg/m     Wt Readings from Last 3 Encounters:  11/04/17 215 lb 3.2 oz (97.6 kg)  10/18/17 205 lb (93 kg)  09/30/17 207 lb 6.4 oz (94.1 kg)     GEN:  Well nourished, well developed in no acute  distress HEENT: Normal NECK: No JVD; No carotid bruits LYMPHATICS: No lymphadenopathy CARDIAC: RRR, paradoxically split second heart sound, no murmurs, rubs, gallops.  Well-healed sternotomy scar RESPIRATORY: Still has a small area of dullness to percussion and reduced breath sounds in the base of the left lung, no more than 1/5 of the way up, otherwise clear to auscultation without rales, wheezing or rhonchi  ABDOMEN: Soft, non-tender, non-distended MUSCULOSKELETAL:  No edema; No deformity  SKIN: Warm and dry NEUROLOGIC:  Alert and oriented x 3 PSYCHIATRIC:  Normal affect   ASSESSMENT:    1. Coronary artery disease involving native coronary artery of native heart without angina pectoris   2. Hypercholesterolemia   3. Essential hypertension   4. Preoperative cardiovascular examination   5. Mild obesity    PLAN:    In order of problems listed above:  1. CAD: Asymptomatic, excellent postop recovery.  Will reassess left ventricular systolic function with an echo; this is nonurgent).  Reduce aspirin to 80 mg once daily. 2. HLP: On high-dose atorvastatin.  Most recent LDL cholesterol was 72. 3. HTN: At home his blood pressure has been consistently less than 130/80.  No changes made to his medications today.  I asked his wife around to check his blood pressure daily over the next 3 days but me know what the results are Monday. 4. Preop CV eval: Following his revascularization, I believe he is now at low risk for any major cardiovascular complications with planned prostatectomy. 5. 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  It sounds like he could do better with a reduced intake of starchy foods such as bread and potatoes.  Target weight loss down to 200 pounds over the next year or so.       Medication Adjustments/Labs and Tests Ordered: Current medicines are reviewed at length with the patient  today.  Concerns regarding medicines are outlined above.  Orders Placed This Encounter  Procedures  . ECHOCARDIOGRAM COMPLETE   No orders of the defined types were placed in this encounter.   Patient Instructions  Medication Instructions: Dr Sallyanne Kuster has recommended making the following medication changes: 1. DECREASE Aspirin to 81 mg daily  Labwork: NONE ORDERED  Testing/Procedures: 1. Echocardiogram - Your physician has requested that you have an echocardiogram. Echocardiography is a painless test that uses sound waves to create images of your heart. It provides your doctor with information about the size and shape of your heart and how well your heart's chambers and valves are working. This procedure takes approximately one hour. There are no restrictions for this procedure.  >> This will be performed at our [facility] location [place], [place] [address] [phone]  Follow-up: Dr Sallyanne Kuster recommends that you schedule a follow-up appointment in 6 months. You will receive a reminder letter in the mail two months in advance. If you don't receive a letter, please call our office to schedule the follow-up appointment.  If you need a refill on your cardiac medications before your next appointment, please call your pharmacy.    Signed, Sanda Klein, MD  11/04/2017 2:03 PM    [place]

## 2017-11-04 NOTE — Patient Instructions (Signed)
Medication Instructions: Dr Sallyanne Kuster has recommended making the following medication changes: 1. DECREASE Aspirin to 81 mg daily  Labwork: NONE ORDERED  Testing/Procedures: 1. Echocardiogram - Your physician has requested that you have an echocardiogram. Echocardiography is a painless test that uses sound waves to create images of your heart. It provides your doctor with information about the size and shape of your heart and how well your heart's chambers and valves are working. This procedure takes approximately one hour. There are no restrictions for this procedure.  >> This will be performed at our [facility] location [place], [place] [address] [phone]  Follow-up: Dr Sallyanne Kuster recommends that you schedule a follow-up appointment in 6 months. You will receive a reminder letter in the mail two months in advance. If you don't receive a letter, please call our office to schedule the follow-up appointment.  If you need a refill on your cardiac medications before your next appointment, please call your pharmacy.

## 2017-11-08 ENCOUNTER — Telehealth: Payer: Self-pay

## 2017-11-08 DIAGNOSIS — I10 Essential (primary) hypertension: Secondary | ICD-10-CM

## 2017-11-08 NOTE — Telephone Encounter (Signed)
Received patient's blood pressure log per MD request. See office note 11/03/17.  BP ranges 118/78-180/118.   Per MCr - order 24h BP monitor.  lmtcb Message routed to admin for scheduling.

## 2017-11-11 ENCOUNTER — Ambulatory Visit (HOSPITAL_COMMUNITY): Payer: 59

## 2017-11-11 ENCOUNTER — Other Ambulatory Visit: Payer: Self-pay

## 2017-11-11 DIAGNOSIS — R001 Bradycardia, unspecified: Secondary | ICD-10-CM

## 2017-11-11 DIAGNOSIS — I10 Essential (primary) hypertension: Secondary | ICD-10-CM

## 2017-11-15 ENCOUNTER — Ambulatory Visit (HOSPITAL_COMMUNITY): Payer: 59

## 2017-11-15 MED FILL — ATORVASTATIN 80 MG TABLET: 80 | 30 days supply | Qty: 30 | Fill #2

## 2017-11-17 ENCOUNTER — Ambulatory Visit (HOSPITAL_COMMUNITY): Payer: 59

## 2017-11-17 ENCOUNTER — Encounter: Payer: Self-pay | Admitting: *Deleted

## 2017-11-17 ENCOUNTER — Ambulatory Visit: Payer: 59

## 2017-11-17 DIAGNOSIS — R001 Bradycardia, unspecified: Secondary | ICD-10-CM

## 2017-11-17 DIAGNOSIS — I10 Essential (primary) hypertension: Secondary | ICD-10-CM | POA: Diagnosis not present

## 2017-11-17 NOTE — Progress Notes (Signed)
Patient ID: Brendan Jackson, male   DOB: 10/23/1950, 67 y.o.   MRN: [id number] 24 hour ambulatory blood pressure monitor applied to patient using standard adult cuff.

## 2017-11-19 ENCOUNTER — Ambulatory Visit (HOSPITAL_COMMUNITY): Payer: 59

## 2017-11-19 ENCOUNTER — Other Ambulatory Visit: Payer: Self-pay | Admitting: Urology

## 2017-11-19 DIAGNOSIS — C61 Malignant neoplasm of prostate: Secondary | ICD-10-CM | POA: Diagnosis not present

## 2017-11-22 ENCOUNTER — Ambulatory Visit (HOSPITAL_COMMUNITY): Payer: 59

## 2017-11-24 ENCOUNTER — Ambulatory Visit (HOSPITAL_COMMUNITY): Payer: 59

## 2017-11-26 ENCOUNTER — Ambulatory Visit (HOSPITAL_COMMUNITY): Payer: 59

## 2017-11-26 MED FILL — LISINOPRIL 5 MG TABLET: 5 | 30 days supply | Qty: 30 | Fill #2

## 2017-11-29 ENCOUNTER — Ambulatory Visit (HOSPITAL_COMMUNITY): Payer: 59

## 2017-12-01 ENCOUNTER — Ambulatory Visit (HOSPITAL_COMMUNITY): Payer: 59

## 2017-12-03 ENCOUNTER — Ambulatory Visit (HOSPITAL_COMMUNITY): Payer: 59

## 2017-12-06 ENCOUNTER — Ambulatory Visit (HOSPITAL_COMMUNITY): Payer: 59

## 2017-12-07 ENCOUNTER — Other Ambulatory Visit: Payer: Self-pay

## 2017-12-07 ENCOUNTER — Ambulatory Visit (HOSPITAL_COMMUNITY): Payer: 59 | Attending: Cardiology

## 2017-12-07 DIAGNOSIS — I2510 Atherosclerotic heart disease of native coronary artery without angina pectoris: Secondary | ICD-10-CM | POA: Diagnosis not present

## 2017-12-08 ENCOUNTER — Ambulatory Visit (HOSPITAL_COMMUNITY): Payer: 59

## 2017-12-09 NOTE — Patient Instructions (Signed)
Brendan Jackson  12/09/2017   Your procedure is scheduled on: 12-15-17   Report to [facility] Main  Entrance    Report to admitting at 6:30AM    Call this number if you have problems the morning of surgery [phone]     Remember: Do not eat food After Midnight on Monday 12-13-17 . Drink plenty of clear liquids (see menu below) all day Tuesday 12-14-17  and follow all bowel prep orders provided by your surgeon. Nothing by mouth after midnight on Tuesday !  BRUSH YOUR TEETH MORNING OF SURGERY AND RINSE YOUR MOUTH OUT, NO CHEWING GUM CANDY OR MINTS.      CLEAR LIQUID DIET   Foods Allowed                                                                     Foods Excluded  Coffee and tea, regular and decaf                             liquids that you cannot  Plain Jell-O in any flavor                                             see through such as: Fruit ices (not with fruit pulp)                                     milk, soups, orange juice  Iced Popsicles                                    All solid food Carbonated beverages, regular and diet                                    Cranberry, grape and apple juices Sports drinks like Gatorade Lightly seasoned clear broth or consume(fat free) Sugar, honey syrup  Sample Menu Breakfast                                Lunch                                     Supper Cranberry juice                    Beef broth                            Chicken broth Jell-O  Grape juice                           Apple juice Coffee or tea                        Jell-O                                      Popsicle                                                Coffee or tea                        Coffee or tea  _____________________________________________________________________       Take these medicines the morning of surgery with A SIP OF WATER: carvedilol, tylenol if needed                              You may not have any metal on your body including hair pins and              piercings  Do not wear jewelry, make-up, lotions, powders or perfumes, deodorant                Men may shave face and neck.   Do not bring valuables to the hospital. [place].  Contacts, dentures or bridgework may not be worn into surgery.  Leave suitcase in the car. After surgery it may be brought to your room.                 Please read over the following fact sheets you were given: _____________________________________________________________________             [facility] - Preparing for Surgery Before surgery, you can play an important role.  Because skin is not sterile, your skin needs to be as free of germs as possible.  You can reduce the number of germs on your skin by washing with CHG (chlorahexidine gluconate) soap before surgery.  CHG is an antiseptic cleaner which kills germs and bonds with the skin to continue killing germs even after washing. Please DO NOT use if you have an allergy to CHG or antibacterial soaps.  If your skin becomes reddened/irritated stop using the CHG and inform your nurse when you arrive at Short Stay. Do not shave (including legs and underarms) for at least 48 hours prior to the first CHG shower.  You may shave your face/neck. Please follow these instructions carefully:  1.  Shower with CHG Soap the night before surgery and the  morning of Surgery.  2.  If you choose to wash your hair, wash your hair first as usual with your  normal  shampoo.  3.  After you shampoo, rinse your hair and body thoroughly to remove the  shampoo.                           4.  Use CHG as  you would any other liquid soap.  You can apply chg directly  to the skin and wash                       Gently with a scrungie or clean washcloth.  5.  Apply the CHG Soap to your body ONLY FROM THE NECK DOWN.   Do not use on face/ open                            Wound or open sores. Avoid contact with eyes, ears mouth and genitals (private parts).                       Wash face,  Genitals (private parts) with your normal soap.             6.  Wash thoroughly, paying special attention to the area where your surgery  will be performed.  7.  Thoroughly rinse your body with warm water from the neck down.  8.  DO NOT shower/wash with your normal soap after using and rinsing off  the CHG Soap.                9.  Pat yourself dry with a clean towel.            10.  Wear clean pajamas.            11.  Place clean sheets on your bed the night of your first shower and do not  sleep with pets. Day of Surgery : Do not apply any lotions/deodorants the morning of surgery.  Please wear clean clothes to the hospital/surgery center.  FAILURE TO FOLLOW THESE INSTRUCTIONS MAY RESULT IN THE CANCELLATION OF YOUR SURGERY PATIENT SIGNATURE_________________________________  NURSE SIGNATURE__________________________________  ________________________________________________________________________

## 2017-12-09 NOTE — Progress Notes (Signed)
Lov/ cardiac clearance Dr Sallyanne Kuster 11-04-17 epic   EKG 09-30-17 epic   CXR 10-22-17 epic   ECHO 10-88-19 epic

## 2017-12-10 ENCOUNTER — Other Ambulatory Visit: Payer: Self-pay

## 2017-12-10 ENCOUNTER — Ambulatory Visit (HOSPITAL_COMMUNITY): Payer: 59

## 2017-12-10 ENCOUNTER — Encounter (HOSPITAL_COMMUNITY): Payer: Self-pay

## 2017-12-10 ENCOUNTER — Encounter (HOSPITAL_COMMUNITY)
Admission: RE | Admit: 2017-12-10 | Discharge: 2017-12-10 | Disposition: A | Discharge status: Home / Self Care | Payer: 59 | Source: Ambulatory Visit | Attending: Urology | Admitting: Urology

## 2017-12-10 DIAGNOSIS — Z01812 Encounter for preprocedural laboratory examination: Secondary | ICD-10-CM | POA: Insufficient documentation

## 2017-12-10 DIAGNOSIS — C61 Malignant neoplasm of prostate: Secondary | ICD-10-CM | POA: Diagnosis not present

## 2017-12-10 LAB — BASIC METABOLIC PANEL
Anion gap: 7 (ref 5–15)
BUN: 15 mg/dL (ref 8–23)
CO2: 27 mmol/L (ref 22–32)
Calcium: 9.7 mg/dL (ref 8.9–10.3)
Chloride: 105 mmol/L (ref 98–111)
Creatinine, Ser: 0.91 mg/dL (ref 0.61–1.24)
GFR calc Af Amer: >60 mL/min (ref >60)
Glucose, Bld: 107 mg/dL — ABNORMAL HIGH (ref 70–99)
POTASSIUM: 4.9 mmol/L (ref 3.5–5.1)
Sodium: 139 mmol/L (ref 135–145)

## 2017-12-10 LAB — CBC
HCT: 47.3 % (ref 39.0–52.0)
HEMOGLOBIN: 15.1 g/dL (ref 13.0–17.0)
MCH: 30.6 pg (ref 26.0–34.0)
MCHC: 31.9 g/dL (ref 30.0–36.0)
MCV: 95.7 fL (ref 80.0–100.0)
Platelets: 210 10*3/uL (ref 150–400)
RBC: 4.94 MIL/uL (ref 4.22–5.81)
RDW: 14.4 % (ref 11.5–15.5)
WBC: 6.2 10*3/uL (ref 4.0–10.5)
nRBC: 0 % (ref 0.0–0.2)

## 2017-12-13 ENCOUNTER — Ambulatory Visit (HOSPITAL_COMMUNITY): Payer: 59

## 2017-12-13 MED ORDER — ATORVASTATIN CALCIUM 80 MG PO TABS: 80.0000 mg | ORAL_TABLET | Freq: Every day | ORAL | 3 refills | Status: DC

## 2017-12-13 MED FILL — ATORVASTATIN 80 MG TABLET: 80 | 90 days supply | Qty: 90 | Fill #0

## 2017-12-14 NOTE — Anesthesia Preprocedure Evaluation (Addendum)
Anesthesia Evaluation  Patient identified by MRN, date of birth, ID band Patient awake    Reviewed: Allergy & Precautions, NPO status , Patient's Chart, lab work & pertinent test results  History of Anesthesia Complications Negative for: history of anesthetic complications  Airway Mallampati: II  TM Distance: >3 FB Neck ROM: Full    Dental  (+) Dental Advisory Given, Teeth Intact   Pulmonary neg pulmonary ROS,    breath sounds clear to auscultation       Cardiovascular hypertension, Pt. on home beta blockers and Pt. on medications (-) angina+ CAD, + Past MI and + CABG   Rhythm:Regular Rate:Normal   S/p CABG 08/2017   '19 TTE (post CABG) - Mild concentric LVH. EF 55% to 60%. Mild AI. Aortic root dimension: 39 mm Ascending aortic diameter: 42 mm.Trivial MR. LA was mildly dilated. RV cavity size was moderately dilated. RV systolic function was moderately reduced. RA was moderately dilated. Mild PR.  '19 Carotid US - 1-39% ICAS b/l   Neuro/Psych negative neurological ROS  negative psych ROS   GI/Hepatic negative GI ROS, Neg liver ROS,   Endo/Other   Obesity   Renal/GU negative Renal ROS    Prostate cancer     Musculoskeletal negative musculoskeletal ROS (+)   Abdominal   Peds  Hematology negative hematology ROS (+)   Anesthesia Other Findings   Reproductive/Obstetrics                           Anesthesia Physical Anesthesia Plan  ASA: III  Anesthesia Plan: General   Post-op Pain Management:    Induction: Intravenous  PONV Risk Score and Plan: 3 and Treatment may vary due to age or medical condition and Ondansetron  Airway Management Planned: Oral ETT  Additional Equipment: None  Intra-op Plan:   Post-operative Plan: Extubation in OR  Informed Consent: I have reviewed the patients History and Physical, chart, labs and discussed the procedure including the risks, benefits  and alternatives for the proposed anesthesia with the patient or authorized representative who has indicated his/her understanding and acceptance.   Dental advisory given  Plan Discussed with: CRNA and Anesthesiologist  Anesthesia Plan Comments:        Anesthesia Quick Evaluation

## 2017-12-15 ENCOUNTER — Other Ambulatory Visit: Payer: Self-pay

## 2017-12-15 ENCOUNTER — Ambulatory Visit (HOSPITAL_COMMUNITY): Payer: 59

## 2017-12-15 ENCOUNTER — Encounter (HOSPITAL_COMMUNITY)
Admission: RE | Disposition: A | Discharge status: Home / Self Care | Payer: Self-pay | Source: Ambulatory Visit | Attending: Urology

## 2017-12-15 ENCOUNTER — Ambulatory Visit (HOSPITAL_COMMUNITY): Payer: 59 | Admitting: Anesthesiology

## 2017-12-15 ENCOUNTER — Observation Stay (HOSPITAL_COMMUNITY)
Admission: RE | Admit: 2017-12-15 | Discharge: 2017-12-16 | Disposition: A | Discharge status: Home / Self Care | Payer: 59 | Source: Ambulatory Visit | Attending: Urology | Admitting: Urology

## 2017-12-15 ENCOUNTER — Encounter (HOSPITAL_COMMUNITY): Payer: Self-pay | Admitting: Anesthesiology

## 2017-12-15 DIAGNOSIS — E669 Obesity, unspecified: Secondary | ICD-10-CM | POA: Insufficient documentation

## 2017-12-15 DIAGNOSIS — I10 Essential (primary) hypertension: Secondary | ICD-10-CM | POA: Diagnosis not present

## 2017-12-15 DIAGNOSIS — Z23 Encounter for immunization: Secondary | ICD-10-CM | POA: Insufficient documentation

## 2017-12-15 DIAGNOSIS — Z951 Presence of aortocoronary bypass graft: Secondary | ICD-10-CM | POA: Insufficient documentation

## 2017-12-15 DIAGNOSIS — E785 Hyperlipidemia, unspecified: Secondary | ICD-10-CM | POA: Insufficient documentation

## 2017-12-15 DIAGNOSIS — Z7982 Long term (current) use of aspirin: Secondary | ICD-10-CM | POA: Insufficient documentation

## 2017-12-15 DIAGNOSIS — C61 Malignant neoplasm of prostate: Secondary | ICD-10-CM | POA: Diagnosis not present

## 2017-12-15 DIAGNOSIS — Z6832 Body mass index (BMI) 32.0-32.9, adult: Secondary | ICD-10-CM | POA: Diagnosis not present

## 2017-12-15 DIAGNOSIS — Z79899 Other long term (current) drug therapy: Secondary | ICD-10-CM | POA: Diagnosis not present

## 2017-12-15 DIAGNOSIS — I2510 Atherosclerotic heart disease of native coronary artery without angina pectoris: Secondary | ICD-10-CM | POA: Diagnosis not present

## 2017-12-15 DIAGNOSIS — I252 Old myocardial infarction: Secondary | ICD-10-CM | POA: Diagnosis not present

## 2017-12-15 DIAGNOSIS — E7800 Pure hypercholesterolemia, unspecified: Secondary | ICD-10-CM | POA: Diagnosis not present

## 2017-12-15 HISTORY — PX: ROBOT ASSISTED LAPAROSCOPIC RADICAL PROSTATECTOMY: SHX5141

## 2017-12-15 HISTORY — PX: LYMPHADENECTOMY: SHX5960

## 2017-12-15 LAB — HEMOGLOBIN AND HEMATOCRIT, BLOOD
HEMATOCRIT: 46.7 % (ref 39.0–52.0)
Hemoglobin: 14.8 g/dL (ref 13.0–17.0)

## 2017-12-15 SURGERY — PROSTATECTOMY, RADICAL, ROBOT-ASSISTED, LAPAROSCOPIC
Anesthesia: General

## 2017-12-15 MED ORDER — INFLUENZA VAC SPLIT HIGH-DOSE 0.5 ML IM SUSY
0.5000 mL | PREFILLED_SYRINGE | INTRAMUSCULAR | Status: AC
  Administered 2017-12-16: 0.5 mL via INTRAMUSCULAR
  Filled 2017-12-15: qty 0.5

## 2017-12-15 MED ORDER — LIDOCAINE 2% (20 MG/ML) 5 ML SYRINGE
INTRAMUSCULAR | Status: DC | PRN
  Administered 2017-12-15: 100 mg via INTRAVENOUS

## 2017-12-15 MED ORDER — LACTATED RINGERS IR SOLN
Status: DC | PRN
  Administered 2017-12-15: 1000 mL

## 2017-12-15 MED ORDER — OXYCODONE HCL 5 MG PO TABS
5.0000 mg | ORAL_TABLET | ORAL | Status: DC | PRN
  Administered 2017-12-15 (×2): 5 mg via ORAL
  Filled 2017-12-15 (×2): qty 1

## 2017-12-15 MED ORDER — SULFAMETHOXAZOLE-TRIMETHOPRIM 800-160 MG PO TABS: 1.0000 | ORAL_TABLET | Freq: Two times a day (BID) | ORAL | 0 refills | Status: DC

## 2017-12-15 MED ORDER — HYDROCODONE-ACETAMINOPHEN 5-325 MG PO TABS: 1.0000 | ORAL_TABLET | Freq: Four times a day (QID) | ORAL | 0 refills | Status: DC | PRN

## 2017-12-15 MED ORDER — DEXTROSE-NACL 5-0.45 % IV SOLN
INTRAVENOUS | Status: DC
  Administered 2017-12-15 – 2017-12-16 (×2): via INTRAVENOUS

## 2017-12-15 MED ORDER — DIPHENHYDRAMINE HCL 12.5 MG/5ML PO ELIX: 12.5000 mg | ORAL_SOLUTION | Freq: Four times a day (QID) | ORAL | Status: DC | PRN

## 2017-12-15 MED ORDER — ACETAMINOPHEN 500 MG PO TABS
1000.0000 mg | ORAL_TABLET | Freq: Four times a day (QID) | ORAL | Status: AC
  Administered 2017-12-15 – 2017-12-16 (×4): 1000 mg via ORAL
  Filled 2017-12-15 (×4): qty 2

## 2017-12-15 MED ORDER — FENTANYL CITRATE (PF) 100 MCG/2ML IJ SOLN
INTRAMUSCULAR | Status: AC
  Administered 2017-12-15: 14:00:00
  Filled 2017-12-15: qty 2

## 2017-12-15 MED ORDER — LACTATED RINGERS IV SOLN
INTRAVENOUS | Status: DC
  Administered 2017-12-15 (×2): via INTRAVENOUS

## 2017-12-15 MED ORDER — MIDAZOLAM HCL 2 MG/2ML IJ SOLN
INTRAMUSCULAR | Status: AC
  Filled 2017-12-15: qty 2

## 2017-12-15 MED ORDER — ROCURONIUM BROMIDE 10 MG/ML (PF) SYRINGE
PREFILLED_SYRINGE | INTRAVENOUS | Status: DC | PRN
  Administered 2017-12-15: 60 mg via INTRAVENOUS
  Administered 2017-12-15: 20 mg via INTRAVENOUS
  Administered 2017-12-15: 10 mg via INTRAVENOUS
  Administered 2017-12-15: 20 mg via INTRAVENOUS
  Administered 2017-12-15 (×2): 10 mg via INTRAVENOUS
  Administered 2017-12-15: 20 mg via INTRAVENOUS
  Administered 2017-12-15: 10 mg via INTRAVENOUS

## 2017-12-15 MED ORDER — SUGAMMADEX SODIUM 500 MG/5ML IV SOLN
INTRAVENOUS | Status: AC
  Filled 2017-12-15: qty 5

## 2017-12-15 MED ORDER — ROCURONIUM BROMIDE 10 MG/ML (PF) SYRINGE
PREFILLED_SYRINGE | INTRAVENOUS | Status: AC
  Filled 2017-12-15: qty 10

## 2017-12-15 MED ORDER — CEFAZOLIN SODIUM-DEXTROSE 2-4 GM/100ML-% IV SOLN
2.0000 g | INTRAVENOUS | Status: AC
  Administered 2017-12-15: 2 g via INTRAVENOUS
  Filled 2017-12-15: qty 100

## 2017-12-15 MED ORDER — PROPOFOL 10 MG/ML IV BOLUS
INTRAVENOUS | Status: DC | PRN
  Administered 2017-12-15: 20 mg via INTRAVENOUS
  Administered 2017-12-15: 130 mg via INTRAVENOUS
  Administered 2017-12-15: 30 mg via INTRAVENOUS

## 2017-12-15 MED ORDER — LISINOPRIL 5 MG PO TABS
5.0000 mg | ORAL_TABLET | Freq: Every day | ORAL | Status: DC
  Administered 2017-12-15 – 2017-12-16 (×2): 5 mg via ORAL
  Filled 2017-12-15 (×2): qty 1

## 2017-12-15 MED ORDER — BELLADONNA ALKALOIDS-OPIUM 16.2-60 MG RE SUPP: 1.0000 | Freq: Four times a day (QID) | RECTAL | Status: DC | PRN

## 2017-12-15 MED ORDER — FENTANYL CITRATE (PF) 100 MCG/2ML IJ SOLN
25.0000 ug | INTRAMUSCULAR | Status: DC | PRN
  Administered 2017-12-15: 25 ug via INTRAVENOUS

## 2017-12-15 MED ORDER — MIDAZOLAM HCL 5 MG/5ML IJ SOLN
INTRAMUSCULAR | Status: DC | PRN
  Administered 2017-12-15: 2 mg via INTRAVENOUS

## 2017-12-15 MED ORDER — SODIUM CHLORIDE 0.9 % IJ SOLN
INTRAMUSCULAR | Status: AC
  Filled 2017-12-15: qty 20

## 2017-12-15 MED ORDER — PROPOFOL 10 MG/ML IV BOLUS
INTRAVENOUS | Status: AC
  Filled 2017-12-15: qty 40

## 2017-12-15 MED ORDER — ATORVASTATIN CALCIUM 40 MG PO TABS
80.0000 mg | ORAL_TABLET | Freq: Every day | ORAL | Status: DC
  Administered 2017-12-15: 80 mg via ORAL
  Filled 2017-12-15: qty 2

## 2017-12-15 MED ORDER — OXYCODONE HCL 5 MG PO TABS: 5.0000 mg | ORAL_TABLET | Freq: Once | ORAL | Status: DC | PRN

## 2017-12-15 MED ORDER — SUGAMMADEX SODIUM 500 MG/5ML IV SOLN
INTRAVENOUS | Status: DC | PRN
  Administered 2017-12-15: 300 mg via INTRAVENOUS

## 2017-12-15 MED ORDER — PNEUMOCOCCAL VAC POLYVALENT 25 MCG/0.5ML IJ INJ
0.5000 mL | INJECTION | INTRAMUSCULAR | Status: AC
  Administered 2017-12-16: 0.5 mL via INTRAMUSCULAR
  Filled 2017-12-15: qty 0.5

## 2017-12-15 MED ORDER — MAGNESIUM CITRATE PO SOLN: 1.0000 | Freq: Once | ORAL | Status: DC

## 2017-12-15 MED ORDER — HYDROMORPHONE HCL 1 MG/ML IJ SOLN
INTRAMUSCULAR | Status: DC | PRN
  Administered 2017-12-15 (×5): .4 mg via INTRAVENOUS

## 2017-12-15 MED ORDER — EPHEDRINE SULFATE-NACL 50-0.9 MG/10ML-% IV SOSY
PREFILLED_SYRINGE | INTRAVENOUS | Status: DC | PRN
  Administered 2017-12-15 (×3): 5 mg via INTRAVENOUS
  Administered 2017-12-15: 10 mg via INTRAVENOUS

## 2017-12-15 MED ORDER — FUROSEMIDE 40 MG PO TABS: 40.0000 mg | ORAL_TABLET | Freq: Every day | ORAL | Status: DC | PRN

## 2017-12-15 MED ORDER — BUPIVACAINE LIPOSOME 1.3 % IJ SUSP
20.0000 mL | Freq: Once | INTRAMUSCULAR | Status: AC
  Administered 2017-12-15: 20 mL
  Administered 2017-12-15: 266 mg
  Filled 2017-12-15: qty 20

## 2017-12-15 MED ORDER — HYDROMORPHONE HCL 1 MG/ML IJ SOLN: 0.5000 mg | INTRAMUSCULAR | Status: DC | PRN

## 2017-12-15 MED ORDER — ONDANSETRON HCL 4 MG/2ML IJ SOLN
INTRAMUSCULAR | Status: DC | PRN
  Administered 2017-12-15: 4 mg via INTRAVENOUS

## 2017-12-15 MED ORDER — DEXAMETHASONE SODIUM PHOSPHATE 10 MG/ML IJ SOLN
INTRAMUSCULAR | Status: DC | PRN
  Administered 2017-12-15: 10 mg via INTRAVENOUS

## 2017-12-15 MED ORDER — SODIUM CHLORIDE 0.9 % IJ SOLN
INTRAMUSCULAR | Status: DC | PRN
  Administered 2017-12-15: 20 mL

## 2017-12-15 MED ORDER — SODIUM CHLORIDE 0.9 % IV BOLUS
1000.0000 mL | Freq: Once | INTRAVENOUS | Status: AC
  Administered 2017-12-15: 1000 mL via INTRAVENOUS

## 2017-12-15 MED ORDER — FENTANYL CITRATE (PF) 250 MCG/5ML IJ SOLN
INTRAMUSCULAR | Status: AC
  Filled 2017-12-15: qty 5

## 2017-12-15 MED ORDER — DIPHENHYDRAMINE HCL 50 MG/ML IJ SOLN: 12.5000 mg | Freq: Four times a day (QID) | INTRAMUSCULAR | Status: DC | PRN

## 2017-12-15 MED ORDER — FENTANYL CITRATE (PF) 100 MCG/2ML IJ SOLN
INTRAMUSCULAR | Status: DC | PRN
  Administered 2017-12-15 (×4): 50 ug via INTRAVENOUS
  Administered 2017-12-15: 100 ug via INTRAVENOUS

## 2017-12-15 MED ORDER — ONDANSETRON HCL 4 MG/2ML IJ SOLN: 4.0000 mg | Freq: Once | INTRAMUSCULAR | Status: DC | PRN

## 2017-12-15 MED ORDER — ONDANSETRON HCL 4 MG/2ML IJ SOLN: 4.0000 mg | INTRAMUSCULAR | Status: DC | PRN

## 2017-12-15 MED ORDER — HYDROMORPHONE HCL 2 MG/ML IJ SOLN
INTRAMUSCULAR | Status: AC
  Filled 2017-12-15: qty 1

## 2017-12-15 MED ORDER — FENTANYL CITRATE (PF) 100 MCG/2ML IJ SOLN
INTRAMUSCULAR | Status: AC
  Filled 2017-12-15: qty 2

## 2017-12-15 MED ORDER — GLYCOPYRROLATE 0.2 MG/ML IJ SOLN
INTRAMUSCULAR | Status: DC | PRN
  Administered 2017-12-15: 0.2 mg via INTRAVENOUS

## 2017-12-15 MED ORDER — CARVEDILOL 3.125 MG PO TABS
3.1250 mg | ORAL_TABLET | Freq: Two times a day (BID) | ORAL | Status: DC
  Administered 2017-12-15 – 2017-12-16 (×2): 3.125 mg via ORAL
  Filled 2017-12-15 (×2): qty 1

## 2017-12-15 MED ORDER — OXYCODONE HCL 5 MG/5ML PO SOLN: 5.0000 mg | Freq: Once | ORAL | Status: DC | PRN

## 2017-12-15 SURGICAL SUPPLY — 63 items
APPLICATOR COTTON TIP 6 STRL (MISCELLANEOUS) ×2 IMPLANT
APPLICATOR COTTON TIP 6IN STRL (MISCELLANEOUS) ×4
CATH FOLEY 2WAY SLVR 18FR 30CC (CATHETERS) ×4 IMPLANT
CATH TIEMANN FOLEY 18FR 5CC (CATHETERS) ×4 IMPLANT
CHLORAPREP W/TINT 26ML (MISCELLANEOUS) ×4 IMPLANT
CLIP VESOLOCK LG 6/CT PURPLE (CLIP) ×12 IMPLANT
CLOTH BEACON ORANGE TIMEOUT ST (SAFETY) ×4 IMPLANT
CONT SPEC 4OZ CLIKSEAL STRL BL (MISCELLANEOUS) ×4 IMPLANT
COVER TIP SHEARS 8 DVNC (MISCELLANEOUS) ×4 IMPLANT
COVER TIP SHEARS 8MM DA VINCI (MISCELLANEOUS) ×4
COVER WAND RF STERILE (DRAPES) ×4 IMPLANT
CUTTER ECHEON FLEX ENDO 45 340 (ENDOMECHANICALS) ×4 IMPLANT
DECANTER SPIKE VIAL GLASS SM (MISCELLANEOUS) ×4 IMPLANT
DERMABOND ADVANCED (GAUZE/BANDAGES/DRESSINGS) ×2
DERMABOND ADVANCED .7 DNX12 (GAUZE/BANDAGES/DRESSINGS) ×2 IMPLANT
DRAPE ARM DVNC X/XI (DISPOSABLE) ×8 IMPLANT
DRAPE COLUMN DVNC XI (DISPOSABLE) ×2 IMPLANT
DRAPE DA VINCI XI ARM (DISPOSABLE) ×8
DRAPE DA VINCI XI COLUMN (DISPOSABLE) ×2
DRAPE SURG IRRIG POUCH 19X23 (DRAPES) ×4 IMPLANT
DRSG TEGADERM 4X4.75 (GAUZE/BANDAGES/DRESSINGS) ×4 IMPLANT
ELECT PENCIL ROCKER SW 15FT (MISCELLANEOUS) ×4 IMPLANT
ELECT REM PT RETURN 15FT ADLT (MISCELLANEOUS) ×4 IMPLANT
GAUZE SPONGE 2X2 8PLY STRL LF (GAUZE/BANDAGES/DRESSINGS) IMPLANT
GLOVE BIO SURGEON STRL SZ 6.5 (GLOVE) ×3 IMPLANT
GLOVE BIO SURGEONS STRL SZ 6.5 (GLOVE) ×1
GLOVE BIOGEL M STRL SZ7.5 (GLOVE) ×8 IMPLANT
GLOVE BIOGEL PI IND STRL 7.5 (GLOVE) ×2 IMPLANT
GLOVE BIOGEL PI INDICATOR 7.5 (GLOVE) ×2
GOWN STRL REUS W/TWL LRG LVL3 (GOWN DISPOSABLE) ×12 IMPLANT
HOLDER FOLEY CATH W/STRAP (MISCELLANEOUS) ×4 IMPLANT
IRRIG SUCT STRYKERFLOW 2 WTIP (MISCELLANEOUS) ×4
IRRIGATION SUCT STRKRFLW 2 WTP (MISCELLANEOUS) ×2 IMPLANT
IV LACTATED RINGERS 1000ML (IV SOLUTION) IMPLANT
KIT PROCEDURE DA VINCI SI (MISCELLANEOUS) ×2
KIT PROCEDURE DVNC SI (MISCELLANEOUS) ×2 IMPLANT
NEEDLE INSUFFLATION 14GA 120MM (NEEDLE) ×4 IMPLANT
NEEDLE SPNL 22GX7 QUINCKE BK (NEEDLE) ×4 IMPLANT
PACK ROBOT UROLOGY CUSTOM (CUSTOM PROCEDURE TRAY) ×4 IMPLANT
PAD POSITIONING PINK XL (MISCELLANEOUS) ×4 IMPLANT
PORT ACCESS TROCAR AIRSEAL 12 (TROCAR) ×2 IMPLANT
PORT ACCESS TROCAR AIRSEAL 5M (TROCAR) ×2
SEAL CANN UNIV 5-8 DVNC XI (MISCELLANEOUS) ×8 IMPLANT
SEAL XI 5MM-8MM UNIVERSAL (MISCELLANEOUS) ×8
SET TRI-LUMEN FLTR TB AIRSEAL (TUBING) ×4 IMPLANT
SOLUTION ELECTROLUBE (MISCELLANEOUS) ×4 IMPLANT
SPONGE GAUZE 2X2 STER 10/PKG (GAUZE/BANDAGES/DRESSINGS)
SPONGE LAP 4X18 RFD (DISPOSABLE) ×4 IMPLANT
STAPLE RELOAD 45 GRN (STAPLE) ×2 IMPLANT
STAPLE RELOAD 45MM GREEN (STAPLE) ×2
SUT ETHILON 3 0 PS 1 (SUTURE) ×4 IMPLANT
SUT MNCRL AB 4-0 PS2 18 (SUTURE) ×8 IMPLANT
SUT PDS AB 1 CT1 27 (SUTURE) ×8 IMPLANT
SUT PROLENE 4 0 RB 1 (SUTURE) ×2
SUT PROLENE 4-0 RB1 .5 CRCL 36 (SUTURE) ×2 IMPLANT
SUT VIC AB 2-0 SH 27 (SUTURE) ×2
SUT VIC AB 2-0 SH 27X BRD (SUTURE) ×2 IMPLANT
SUT VICRYL 0 UR6 27IN ABS (SUTURE) ×4 IMPLANT
SUT VLOC BARB 180 ABS3/0GR12 (SUTURE) ×12
SUTURE VLOC BRB 180 ABS3/0GR12 (SUTURE) ×6 IMPLANT
SYR 27GX1/2 1ML LL SAFETY (SYRINGE) ×4 IMPLANT
TOWEL OR NON WOVEN STRL DISP B (DISPOSABLE) ×4 IMPLANT
WATER STERILE IRR 1000ML POUR (IV SOLUTION) ×4 IMPLANT

## 2017-12-15 NOTE — H&P (Signed)
Brendan Jackson is an 67 y.o. male.    Chief Complaint: Pre-Op Prostatectomy  HPI:   1 - Moderate Risk Prostate Cancer - 6/12 cores Gleason 4+3=7 up to 80% of core by BX 04/2017 on eval PSA 13.3. TRUS 50 mL with mid lobe small cyst and early median lobe. Had rad[facility]onc opinion from [facility] 05/2017.   He was [place] planning on prostatecotmy 08/2017 but had NSTEMI, CABG. Now cleared for surgery by his Cardiologst Dr Sallyanne Kuster.   PMH sig for mild obesity, HTN, CAD/CABG. 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 His PCP is Teressa Lower MD with [facility].   Today " Brendan Jackson " is seen to proceed with prostatectomy.    Past Medical History:  Diagnosis Date  . Hyperlipidemia LDL goal <70   . Hypertension   . NSTEMI (non-ST elevated myocardial infarction) ([place]) 09/05/2017  . Prostate cancer [facility])     Past Surgical History:  Procedure Laterality Date  . CATARACT EXTRACTION  2015  . CORONARY ARTERY BYPASS GRAFT N/A 09/09/2017   Procedure: CORONARY ARTERY BYPASS GRAFTING (CABG) x 4, USING LEFT INTERNAL MAMMARY ARTERY AND RIGHT AND LEFT GREATER SAPHENOUS VEIN HARVESTED ENDOSCOPICALLY;  Surgeon: Ivin Poot, MD;  Location: [place];  Service: Open Heart Surgery;  Laterality: N/A;  . IR THORACENTESIS ASP PLEURAL SPACE W/IMG GUIDE  10/22/2017  . LEFT HEART CATH AND CORONARY ANGIOGRAPHY N/A 09/06/2017   Procedure: LEFT HEART CATH AND CORONARY ANGIOGRAPHY;  Surgeon: Lorretta Harp, MD;  Location: [place] CV LAB;  Service: Cardiovascular;  Laterality: N/A;  . PROSTATE BIOPSY  05/13/2017  . TEE WITHOUT CARDIOVERSION N/A 09/09/2017   Procedure: TRANSESOPHAGEAL ECHOCARDIOGRAM (TEE);  Surgeon: Prescott Gum, Collier Salina, MD;  Location: [place];  Service: Open Heart Surgery;  Laterality: N/A;  . TENDON REPAIR Right 1987   Thumb  . TONSILLECTOMY      Family History  Problem Relation Age of Onset  . Cancer Paternal Aunt        breast  . Cancer Paternal Uncle        prostate  and brain  . Cancer Paternal Uncle        head and neck ca with lung involvement  . Cancer Paternal Aunt        breast  . Cancer Father        pancreatic   Social History:  reports that he has never smoked. He quit smokeless tobacco use about 4 years ago.  His smokeless tobacco use included chew. He reports that he drinks about 1.0 standard drinks of alcohol per week. He reports that he does not use drugs.  Allergies: No Known Allergies  Medications Prior to Admission  Medication Sig Dispense Refill  . acetaminophen (TYLENOL) 325 MG tablet Take 2 tablets (650 mg total) by mouth every 6 (six) hours as needed for mild pain.    Marland Kitchen aspirin EC 325 MG EC tablet Take 1 tablet (325 mg total) by mouth daily. 30 tablet 0  . carvedilol (COREG) 3.125 MG tablet Take 1 tablet (3.125 mg total) by mouth 2 (two) times daily with a meal. 180 tablet 0  . furosemide (LASIX) 40 MG tablet Take 1 tablet (40 mg total) by mouth daily. (Patient taking differently: Take 40 mg by mouth daily as needed for edema. ) 30 tablet 1  . lisinopril (PRINIVIL,ZESTRIL) 5 MG tablet Take 1 tablet (5 mg total) by mouth daily. 30 tablet 3  . atorvastatin (  LIPITOR) 80 MG tablet Take 1 tablet (80 mg total) by mouth daily at 6 PM. 90 tablet 3    No results found for this or any previous visit (from the past 48 hour(s)). No results found.  Review of Systems  Constitutional: Negative.  Negative for chills and fever.  HENT: Negative.   Eyes: Negative.   Respiratory: Negative.   Cardiovascular: Negative.  Negative for chest pain.  Gastrointestinal: Negative.   Genitourinary: Negative.   Musculoskeletal: Negative.   Skin: Negative.   Neurological: Negative.   Endo/Heme/Allergies: Negative.   Psychiatric/Behavioral: Negative.     Blood pressure (!) 140/91, pulse (!) 58, temperature 98.4 F (36.9 C), temperature source Oral, resp. rate 16, SpO2 99 %. Physical Exam  Constitutional: He appears well-developed.  HENT:  Head:  Normocephalic.  Eyes: Pupils are equal, round, and reactive to light.  Neck: Normal range of motion.  Cardiovascular: Normal rate.  Respiratory: Effort normal.  GI: Soft.  Genitourinary:  Genitourinary Comments: No CVAT  Musculoskeletal: Normal range of motion.  Neurological: He is alert.  Skin: Skin is warm.  Psychiatric: He has a normal mood and affect.     Assessment/Plan  Proceed as planned with prostatectomy. Risks, benefits, alternatives, expected peri-op course discussed previously and reiterated today.   Alexis Frock, MD 12/15/2017, 7:14 AM

## 2017-12-15 NOTE — Op Note (Signed)
NAME: Brendan Jackson, Brendan Jackson MEDICAL RECORD IW:[id number] ACCOUNT [id number] DATE OF BIRTH:[birth year] FACILITY: [facility] LOCATION: [facility] PHYSICIAN:Rosselyn Martha, MD  OPERATIVE REPORT  DATE OF PROCEDURE:  12/15/2017  PREOPERATIVE DIAGNOSIS:  Moderate risk prostate cancer.  PROCEDURE: 1.  Robotic-assisted laparoscopic radical prostatectomy. 2.  Bilateral pelvic lymphadenectomy. 3.  Injection of an indocyanine green dye for sentinel lymph angiography.  ASSISTANT:  Debbrah Alar, PA  ESTIMATED BLOOD LOSS:  Under 150 mL.  COMPLICATIONS:  None.  SPECIMENS: 1.  Periprostatic fat. 2.  Right external iliac lymph nodes, sentinel 3.  Right perivesical lymph nodes, sentinel 4.  Right upper lymph nodes. 5.  Left external iliac lymph nodes, sentinel 6.  Left obturator lymph nodes. 7.  Radical prostatectomy.  FINDINGS: 1.  Multifocal sentinel lymph nodes within bilateral external iliac as well as right perivesical.  These were labeled as such. 2.  Small median lobe.  This was managed by purposeful undermining of bladder neck.  INDICATIONS:  The patient is a very pleasant 67 year old gentleman who was found on workup of elevated PSA to have multifocal adenocarcinoma of the prostate.  He was originally scheduled for surgery several months ago; however, he had a heart attack and  required open coronary revascularization and has done quite well with this since.  He has also had some purposeful weight.  He has been cleared by cardiology to proceed now with his surgery and he wished to proceed.  Informed consent was obtained and  placed in medical record.  DESCRIPTION OF PROCEDURE:  Patient identified as Brendan Jackson, radical prostatectomy was confirmed.  Procedure timeout was performed and antibiotics administered.  General anesthesia induced.  The patient was placed into a low lithotomy position,  sterile field was created by prepping the patient's penis, perineum and proximal thighs using  iodine and his infra-xiphoid abdomen using chlorhexidine gluconate.  He was further fastened to operative table using 3-inch tape with foam padding.  Next, a  high-flow, low-pressure pneumoperitoneum  using Veress technique in the supraumbilical midline, having passed the aspiration and drop test.  An 8 mm robotic port was placed in the same location.  Laparoscopic examination of the peritoneal cavity revealed  no significant adhesions, no visceral injury.  Distal ports were placed as follows:  Right paramedian 8 mm robotic port, right far lateral 12 mm AirSeal port, right paramedian 5 mm suction port, left paramedian 8 mm robotic port, left far lateral 8 mm  robotic port.  Robot was docked and passed the electronic checks.  Initial attention was directed at developing the space of Retzius.  Incision was made lateral to the right medial umbilical ligament from the midline towards the area of the internal ring  coursing along the iliac vessels towards the area of the right ureter.  The right vas deferens was encountered, purposely ligated and used as a medial bucket handle and the right bladder wall was separated from the pelvic sidewall towards the area of  the endopelvic fascia on the right side.  A mirror image incision was performed on the left side.  Anterior attachments were taken down with cautery scissors.  This was the anterior base of the prostate.  Bladder neck junction was defatted to allow  better demarcation of this area.  The fibrofatty tissue was set aside labeled as periprosthetic fat.  Next, 0.2 mL of an indocyanine green dye was injected into each lobe of the prostate using a percutaneously placed robotically guided spinal needle just  above the pubic ramus.  Exquisite care  was taken to avoid spillage, which did not occur.  The endopelvic fascia was swept away from the lateral aspect of the prostate and base to apex orientation bilaterally.  This exposed the dorsal venous complex  which was  carefully controlled using vascular stapler, taking exquisite care to avoid membranous urethral injury which did not occur.  Then, approximately 10 minutes post-dye injection and the pelvis was inspected under near infrared fluorescence light.  Sentinel lymph angiography revealed lymphatic channels coursing along the prostate and bladder area towards the area of the pelvic lymph node fields.  There is multifocal right external iliac lymph nodes and left external iliac lymph nodes seen.  Right  external iliac group was then dissected free with the boundaries being right external iliac artery, vein, pelvic sidewall, iliac bifurcation.  Lymphostasis was achieved with cold clips.  SI labeled right external iliac nodes, sentinel.  Right obturator  group was dissected free with the boundaries being right external iliac vein, vein, pelvic sidewall, obturator nerve.  Lymphostasis was achieved with cold clips, set aside labeled right obturator lymph nodes.  The obturator nerve was inspected following  maneuvers and found to be uninjured.  Under final inspection of the right hemipelvis under near infrared fluorescence light.  There was another very small lymph node noted in a perivesical location.  This was dissected free and labeled as such.   Similarly, a mirror image lymphadenectomy was performed on the left side, left external iliac and left obturator group respectively.  The left obturator nerve was inspected following maneuvers and found to be uninjured.  Attention was directed at bladder  neck dissection.  Bladder neck was identified by moving the Foley catheter back and forth and a lateral release was performed first on the right side and the left side to better demarcate the bladder neck caliber and bladder neck was carefully dissected  free from the base of the prostate in anterior, posterior direction.  There was noted to be a small median lobe by transrectal ultrasound with a small cyst and purposeful  undermining of the posterior bladder neck was performed to encompass this within  the prostatectomy specimen.  Posterior dissection was then performed by incising approximately 7 mm inferior posterior in the portion of the prostate entering the plane of plane of Denonvilliers.  Bilateral vas deferens were dissected for a distance of  approximately 4 cm, ligated and placed on gentle superior traction.  Bilateral seminal vesicles were dissected to the tip, placed on gentle superior traction.  Dissection proceeded within the plane of plane of Denonvilliers towards the area of the apex  of the prostate.  This exposed the vascular pedicles bilaterally.  The right-sided pedicle was controlled using a sequential clipping technique with the purposeful wide dissection given the patient's extensive right lateral cancer positivity based on  prior biopsy.  A purposeful, very thin dissection was performed on the left side, performing partial nerve sparing on the left and has had a paucity of cancer in this area.  Final apical dissection was performed in the anterior plane by placing the  prostate on gentle superior traction and transecting the membranous urethra coldly.  This completely freed the prostatectomy specimen, which was placed into the Endo-catch bag for later retrieval.  Digital rectal exam was performed using indicator glove  and laparoscopic vision.  No evidence of rectal violation was noted.  Posterior reconstruction was performed using a single V-Loc suture reapproximating the posterior urethral plate to the posterior bladder neck, bringing the structures  into tension-free  apposition.  Mucosa-to-mucosa anastomosis was then performed using double armed V-Loc suture from the 6:00 to 12:00 position bilaterally, which resulted in excellent tension-free apposition.  A final Foley catheter was then placed per urethra, which  irrigated quantitatively.  All sponge, needle counts were correct.  Hemostasis appeared  excellent.  A closed suction drain was brought out the previous left lateral most robotic port site into the peritoneal cavity.  Robot was then undocked.  Specimen  was retrieved by extending the previous camera port site superiorly for a distance of approximately 3 cm, removing the prostatectomy specimen setting aside for pathology.  This extraction site was closed at the level of the skin using a figure-of-eight  PDS x3 followed by reapproximation of Scarpa's with a running Vicryl.  All incision sites were infiltrated with dilute lipolyzed Marcaine and closed with skin using subcuticular Monocryl by Dermabond.  Also, just prior to cessation of pneumoperitoneum,  the right lateral most robotic port site was closed with fascia using Carter-Thomason suture passer and 0 Vicryl.  The procedure terminated.    The patient tolerated the procedure well.  No immediate apparent complications.  The patient was taken to postanesthesia care in stable condition.  Please note, first assistant Debbrah Alar was crucial for all portions of the procedure today.  She provided invaluable retraction, suctioning, specimen manipulation, vascular clipping, and vascular stapling.  AN/NUANCE  D:12/15/2017 T:12/15/2017 JOB:[id number]

## 2017-12-15 NOTE — Anesthesia Postprocedure Evaluation (Signed)
Anesthesia Post Note  Patient: Brendan Jackson  Procedure(s) Performed: XI ROBOTIC ASSISTED LAPAROSCOPIC RADICAL PROSTATECTOMY (N/A ) LYMPHADENECTOMY (Bilateral )     Patient location during evaluation: PACU Anesthesia Type: General Level of consciousness: awake and alert Pain management: pain level controlled Vital Signs Assessment: post-procedure vital signs reviewed and stable Respiratory status: spontaneous breathing, nonlabored ventilation, respiratory function stable and patient connected to nasal cannula oxygen Cardiovascular status: blood pressure returned to baseline and stable Postop Assessment: no apparent nausea or vomiting Anesthetic complications: no    Last Vitals:  Vitals:   12/15/17 1300 12/15/17 1349  BP: (!) 147/83 (!) 154/91  Pulse: 60 63  Resp: 12 16  Temp: 37.3 C 36.8 C  SpO2: 100% 98%    Last Pain:  Vitals:   12/15/17 1349  TempSrc: Oral  PainSc: 0-No pain                 Audry Pili

## 2017-12-15 NOTE — Anesthesia Procedure Notes (Signed)
Procedure Name: Intubation Date/Time: 12/15/2017 8:35 AM Performed by: Lavina Hamman, CRNA Pre-anesthesia Checklist: Patient identified, Emergency Drugs available, Suction available, Patient being monitored and Timeout performed Patient Re-evaluated:Patient Re-evaluated prior to induction Oxygen Delivery Method: Circle system utilized Preoxygenation: Pre-oxygenation with 100% oxygen Induction Type: IV induction Ventilation: Mask ventilation without difficulty, Oral airway inserted - appropriate to patient size and Two handed mask ventilation required Laryngoscope Size: Mac and 4 Grade View: Grade I Tube type: Oral Tube size: 7.5 mm Number of attempts: 1 Airway Equipment and Method: Stylet Placement Confirmation: ETT inserted through vocal cords under direct vision,  positive ETCO2,  CO2 detector and breath sounds checked- equal and bilateral Secured at: 23 cm Tube secured with: Tape Dental Injury: Teeth and Oropharynx as per pre-operative assessment

## 2017-12-15 NOTE — Discharge Instructions (Signed)

## 2017-12-15 NOTE — Brief Op Note (Signed)
12/15/2017  11:45 AM  PATIENT:  Brendan Jackson  67 y.o. male  PRE-OPERATIVE DIAGNOSIS:  PROSTATE CANCER  POST-OPERATIVE DIAGNOSIS:  PROSTATE CANCER  PROCEDURE:  Procedure(s): XI ROBOTIC ASSISTED LAPAROSCOPIC RADICAL PROSTATECTOMY (N/A) LYMPHADENECTOMY (Bilateral)  SURGEON:  Surgeon(s) and Role:    Alexis Frock, MD - Primary  PHYSICIAN ASSISTANT:   ASSISTANTS: Debbrah Alar PA   ANESTHESIA:   general  EBL:  100 mL   BLOOD ADMINISTERED:none  DRAINS: JP to bulb, Foley to gravity   LOCAL MEDICATIONS USED:  MARCAINE     SPECIMEN:  Source of Specimen:  pelvic lymph nodes, prostatectomy  DISPOSITION OF SPECIMEN:  PATHOLOGY  COUNTS:  YES  TOURNIQUET:  * No tourniquets in log *  DICTATION: .Other Dictation: Dictation Number [id number]   PLAN OF CARE: Admit for overnight observation  PATIENT DISPOSITION:  PACU - hemodynamically stable.   Delay start of Pharmacological VTE agent (>24hrs) due to surgical blood loss or risk of bleeding: yes

## 2017-12-15 NOTE — Transfer of Care (Signed)
Immediate Anesthesia Transfer of Care Note  Patient: Brendan Jackson  Procedure(s) Performed: XI ROBOTIC ASSISTED LAPAROSCOPIC RADICAL PROSTATECTOMY (N/A ) LYMPHADENECTOMY (Bilateral )  Patient Location: PACU  Anesthesia Type:General  Level of Consciousness: awake, alert , oriented and patient cooperative  Airway & Oxygen Therapy: Patient Spontanous Breathing and Patient connected to face mask oxygen  Post-op Assessment: Report given to RN and Post -op Vital signs reviewed and stable  Post vital signs: Reviewed and stable  Last Vitals:  Vitals Value Taken Time  BP 136/107 12/15/2017 12:05 PM  Temp    Pulse    Resp 13 12/15/2017 12:07 PM  SpO2    Vitals shown include unvalidated device data.  Last Pain:  Vitals:   12/15/17 0730  TempSrc:   PainSc: 0-No pain         Complications: No apparent anesthesia complications

## 2017-12-15 NOTE — Addendum Note (Signed)
Addendum  created 12/15/17 1429 by West Pugh, CRNA   Intraprocedure Flowsheets edited

## 2017-12-16 ENCOUNTER — Encounter (HOSPITAL_COMMUNITY): Payer: Self-pay | Admitting: Urology

## 2017-12-16 DIAGNOSIS — I10 Essential (primary) hypertension: Secondary | ICD-10-CM | POA: Diagnosis not present

## 2017-12-16 DIAGNOSIS — Z951 Presence of aortocoronary bypass graft: Secondary | ICD-10-CM | POA: Diagnosis not present

## 2017-12-16 DIAGNOSIS — E785 Hyperlipidemia, unspecified: Secondary | ICD-10-CM | POA: Diagnosis not present

## 2017-12-16 DIAGNOSIS — Z6832 Body mass index (BMI) 32.0-32.9, adult: Secondary | ICD-10-CM | POA: Diagnosis not present

## 2017-12-16 DIAGNOSIS — I252 Old myocardial infarction: Secondary | ICD-10-CM | POA: Diagnosis not present

## 2017-12-16 DIAGNOSIS — Z79899 Other long term (current) drug therapy: Secondary | ICD-10-CM | POA: Diagnosis not present

## 2017-12-16 DIAGNOSIS — Z23 Encounter for immunization: Secondary | ICD-10-CM | POA: Diagnosis not present

## 2017-12-16 DIAGNOSIS — E669 Obesity, unspecified: Secondary | ICD-10-CM | POA: Diagnosis not present

## 2017-12-16 DIAGNOSIS — C61 Malignant neoplasm of prostate: Secondary | ICD-10-CM | POA: Diagnosis not present

## 2017-12-16 LAB — BASIC METABOLIC PANEL
ANION GAP: 7 (ref 5–15)
BUN: 19 mg/dL (ref 8–23)
CALCIUM: 8.9 mg/dL (ref 8.9–10.3)
CO2: 25 mmol/L (ref 22–32)
Chloride: 106 mmol/L (ref 98–111)
Creatinine, Ser: 1.07 mg/dL (ref 0.61–1.24)
GFR calc non Af Amer: >60 mL/min (ref >60)
Glucose, Bld: 144 mg/dL — ABNORMAL HIGH (ref 70–99)
Potassium: 4.5 mmol/L (ref 3.5–5.1)
SODIUM: 138 mmol/L (ref 135–145)

## 2017-12-16 LAB — HEMOGLOBIN AND HEMATOCRIT, BLOOD
HEMATOCRIT: 42.5 % (ref 39.0–52.0)
HEMOGLOBIN: 13.5 g/dL (ref 13.0–17.0)

## 2017-12-16 LAB — CREATININE, FLUID (PLEURAL, PERITONEAL, JP DRAINAGE): Creat, Fluid: 1 mg/dL

## 2017-12-16 MED ORDER — SENNOSIDES-DOCUSATE SODIUM 8.6-50 MG PO TABS: 1.0000 | ORAL_TABLET | Freq: Every day | ORAL | 1 refills | Status: DC

## 2017-12-16 MED FILL — SULFAMETHOXAZOLE-TMP DS TAB: 800-160 | 2 days supply | Qty: 6 | Fill #0

## 2017-12-16 MED FILL — HYDROCODON-APAP 5-325: 5-325 | 4 days supply | Qty: 30 | Fill #0

## 2017-12-16 NOTE — Addendum Note (Signed)
Addendum  created 12/16/17 5631 by Lollie Sails, CRNA   Charge Capture section accepted

## 2017-12-16 NOTE — Discharge Summary (Addendum)
[facility] Discharge Summary  Admit date: 12/15/2017  Discharge date and time: 12/16/17   Discharge to: Home  Discharge Service: Urology  Discharge Attending Physician:  Dr. Tresa Moore  Discharge  Diagnoses: <principal problem not specified>  Secondary Diagnosis: Active Problems:   Prostate cancer [facility])   OR Procedures: Procedure(s): XI ROBOTIC ASSISTED LAPAROSCOPIC RADICAL PROSTATECTOMY LYMPHADENECTOMY 12/15/2017   Ancillary Procedures: None   Discharge Day Services: The patient was seen and examined by the Urology team both in the morning and immediately prior to discharge.  Vital signs and laboratory values were stable and within normal limits.  The physical exam was benign and unchanged and all surgical wounds were examined.  Discharge instructions were explained and all questions answered.  Subjective  No acute events overnight. Pain Controlled. No fever or chills.  Objective Patient Vitals for the past 8 hrs:  BP Temp Temp src Pulse SpO2  12/16/17 1101 130/80 - - 63 -  12/16/17 0854 118/70 - - 67 -  12/16/17 0851 118/70 98.2 F (36.8 C) Oral 67 95 %  12/16/17 0603 107/77 98.1 F (36.7 C) Oral 60 95 %   No intake/output data recorded.  General Appearance:        No acute distress Lungs:                       Normal work of breathing on room air Heart:                                Regular rate and rhythm Abdomen:                         Soft, appropriately tender, port incisions and midline extraction incision clean dry and intact with surgical glue. JP site hemostatic Extremities:                      Warm and well perfused   Hospital Course:  The patient underwent a RALP + BPLND on 12/15/2017.  The patient tolerated the procedure well, was extubated in the OR, and afterwards was taken to the PACU for routine post-surgical care. When stable the patient was transferred to the floor.   The patient did well postoperatively.  The patient's diet was slowly  advanced and at the time of discharge was tolerating a regular diet.  The patient was discharged home 1 Day Post-Op, at which point was tolerating a regular solid diet,  have adequate pain control with P.O. pain medication, and could ambulate without difficulty. The patient will follow up with [place] for post op check.   His JP drain was removed prior to discharge. JP creatinine was within normal limits His foley catheter was kept in place. Catheter education was provided.   He will restart baby ASA in a couple of days  Condition at Discharge: Improved  Discharge Medications:  Allergies as of 12/16/2017   No Known Allergies     Medication List    STOP taking these medications   aspirin 325 MG EC tablet     TAKE these medications   acetaminophen 325 MG tablet Commonly known as:  TYLENOL Take 2 tablets (650 mg total) by mouth every 6 (six) hours as needed for mild pain.   atorvastatin 80 MG tablet Commonly known as:  LIPITOR Take 1 tablet (80 mg total) by mouth daily at 6 PM.  carvedilol 3.125 MG tablet Commonly known as:  COREG Take 1 tablet (3.125 mg total) by mouth 2 (two) times daily with a meal.   furosemide 40 MG tablet Commonly known as:  LASIX Take 1 tablet (40 mg total) by mouth daily. What changed:    when to take this  reasons to take this   HYDROcodone-acetaminophen 5-325 MG tablet Commonly known as:  NORCO/VICODIN Take 1-2 tablets by mouth every 6 (six) hours as needed for moderate pain or severe pain.   lisinopril 5 MG tablet Commonly known as:  PRINIVIL,ZESTRIL Take 1 tablet (5 mg total) by mouth daily.   senna-docusate 8.6-50 MG tablet Commonly known as:  Senokot-S Take 1 tablet by mouth daily.   sulfamethoxazole-trimethoprim 800-160 MG tablet Commonly known as:  BACTRIM DS,SEPTRA DS Take 1 tablet by mouth 2 (two) times daily. Start the day prior to foley removal appointment

## 2017-12-17 ENCOUNTER — Ambulatory Visit (HOSPITAL_COMMUNITY): Payer: 59

## 2017-12-20 ENCOUNTER — Ambulatory Visit (HOSPITAL_COMMUNITY): Payer: 59

## 2017-12-22 ENCOUNTER — Ambulatory Visit (HOSPITAL_COMMUNITY): Payer: 59

## 2017-12-22 ENCOUNTER — Ambulatory Visit: Payer: 59 | Admitting: Cardiothoracic Surgery

## 2017-12-24 ENCOUNTER — Ambulatory Visit (HOSPITAL_COMMUNITY): Payer: 59

## 2017-12-27 ENCOUNTER — Ambulatory Visit (HOSPITAL_COMMUNITY): Payer: 59

## 2017-12-27 DIAGNOSIS — N393 Stress incontinence (female) (male): Secondary | ICD-10-CM | POA: Diagnosis not present

## 2017-12-27 MED FILL — LISINOPRIL 5 MG TABLET: 5 | 30 days supply | Qty: 30 | Fill #3

## 2017-12-29 ENCOUNTER — Ambulatory Visit (HOSPITAL_COMMUNITY): Payer: 59

## 2017-12-31 ENCOUNTER — Ambulatory Visit (HOSPITAL_COMMUNITY): Payer: 59

## 2018-01-03 ENCOUNTER — Ambulatory Visit (HOSPITAL_COMMUNITY): Payer: 59

## 2018-01-04 ENCOUNTER — Other Ambulatory Visit: Payer: Self-pay | Admitting: Cardiothoracic Surgery

## 2018-01-04 DIAGNOSIS — Z951 Presence of aortocoronary bypass graft: Secondary | ICD-10-CM

## 2018-01-05 ENCOUNTER — Ambulatory Visit (HOSPITAL_COMMUNITY): Payer: 59

## 2018-01-05 ENCOUNTER — Ambulatory Visit (INDEPENDENT_AMBULATORY_CARE_PROVIDER_SITE_OTHER): Payer: 59 | Admitting: Cardiothoracic Surgery

## 2018-01-05 ENCOUNTER — Other Ambulatory Visit: Payer: Self-pay

## 2018-01-05 ENCOUNTER — Ambulatory Visit
Admission: RE | Admit: 2018-01-05 | Discharge: 2018-01-05 | Disposition: A | Discharge status: Home / Self Care | Payer: 59 | Source: Ambulatory Visit | Attending: Cardiothoracic Surgery | Admitting: Cardiothoracic Surgery

## 2018-01-05 ENCOUNTER — Encounter: Payer: Self-pay | Admitting: Cardiothoracic Surgery

## 2018-01-05 VITALS — BP 150/90 | HR 52 | Resp 18 | Ht 68.0 in | Wt 211.8 lb

## 2018-01-05 DIAGNOSIS — C61 Malignant neoplasm of prostate: Secondary | ICD-10-CM

## 2018-01-05 DIAGNOSIS — Z951 Presence of aortocoronary bypass graft: Secondary | ICD-10-CM

## 2018-01-05 DIAGNOSIS — J90 Pleural effusion, not elsewhere classified: Secondary | ICD-10-CM

## 2018-01-05 NOTE — Progress Notes (Signed)
Thanks so much, Collier Salina! Maddyx Wieck

## 2018-01-05 NOTE — Progress Notes (Signed)
PCP is Dough, Jaymes Graff, MD Referring Provider is Lorretta Harp, MD  Chief Complaint  Patient presents with  . Pleural Effusion    f/u with chest xray, s/p CABG    HPI: Patient returns for final postop follow-up after urgent multivessel CABG this past summer.  Postoperatively he developed a left pleural effusion which required left thoracentesis x1.  3 months after his cardiac surgery he underwent robotic prostatectomy for adenocarcinoma prostate.  This was completed approximately 3 weeks ago.  Prior to surgery he had an echocardiogram which showed normal LV function.  He had no cardiac complications after the prostate surgery.  He currently is doing well at home.  His only complaint is some residual pelvic swelling and discomfort.  Patient is somewhat hypertensive at this visit but he states he takes his blood pressures at home and it is never greater than 951 systolic.  Chest x-ray performed today shows clear lung fields, totally resolved left pleural effusion, cardiac silhouette stable with intact sternal wires.  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  He will be followed by urologist for limitations on activity at this point. Past Medical History:  Diagnosis Date  . Hyperlipidemia LDL goal <70   . Hypertension   . NSTEMI (non-ST elevated myocardial infarction) ([place]) 09/05/2017  . Prostate cancer [facility])     Past Surgical History:  Procedure Laterality Date  . CATARACT EXTRACTION  2015  . CORONARY ARTERY BYPASS GRAFT N/A 09/09/2017   Procedure: CORONARY ARTERY BYPASS GRAFTING (CABG) x 4, USING LEFT INTERNAL MAMMARY ARTERY AND RIGHT AND LEFT GREATER SAPHENOUS VEIN HARVESTED ENDOSCOPICALLY;  Surgeon: Ivin Poot, MD;  Location: [place];  Service: Open Heart Surgery;  Laterality: N/A;  . IR THORACENTESIS ASP PLEURAL SPACE W/IMG GUIDE  10/22/2017  . LEFT HEART CATH AND CORONARY ANGIOGRAPHY N/A 09/06/2017   Procedure: LEFT HEART CATH AND CORONARY ANGIOGRAPHY;  Surgeon: Lorretta Harp, MD;  Location: [place] [facility];  Service: Cardiovascular;  Laterality: N/A;  . LYMPHADENECTOMY Bilateral 12/15/2017   Procedure: LYMPHADENECTOMY;  Surgeon: Alexis Frock, MD;  Location: [facility];  Service: Urology;  Laterality: Bilateral;  . PROSTATE BIOPSY  05/13/2017  . ROBOT ASSISTED LAPAROSCOPIC RADICAL PROSTATECTOMY N/A 12/15/2017   Procedure: XI ROBOTIC ASSISTED LAPAROSCOPIC RADICAL PROSTATECTOMY;  Surgeon: Alexis Frock, MD;  Location: [facility];  Service: Urology;  Laterality: N/A;  . TEE WITHOUT CARDIOVERSION N/A 09/09/2017   Procedure: TRANSESOPHAGEAL ECHOCARDIOGRAM (TEE);  Surgeon: Prescott Gum, Collier Salina, MD;  Location: [place];  Service: Open Heart Surgery;  Laterality: N/A;  . TENDON REPAIR Right 1987   Thumb  . TONSILLECTOMY      Family History  Problem Relation Age of Onset  . Cancer Paternal Aunt        breast  . Cancer Paternal Uncle        prostate and brain  . Cancer Paternal Uncle        head and neck ca with lung involvement  . Cancer Paternal Aunt        breast  . Cancer Father        pancreatic    Social History Social History   Tobacco Use  . Smoking status: Never Smoker  . Smokeless tobacco: Former Systems developer    Types: Chew  Substance Use Topics  . Alcohol use: Yes    Alcohol/week: 1.0 standard drinks    Types: 1 Cans of beer  per week    Frequency: Never    Comment: occas  . Drug use: Never    Current Outpatient Medications  Medication Sig Dispense Refill  . acetaminophen (TYLENOL) 325 MG tablet Take 2 tablets (650 mg total) by mouth every 6 (six) hours as needed for mild pain.    Marland Kitchen atorvastatin (LIPITOR) 80 MG tablet Take 1 tablet (80 mg total) by mouth daily at 6 PM. 90 tablet 3  . carvedilol (COREG) 3.125 MG tablet Take 1 tablet (3.125 mg total) by mouth 2 (two) times daily with a meal. 180 tablet 0  . furosemide (LASIX) 40 MG tablet Take 1 tablet (40 mg total) by mouth  daily. (Patient taking differently: Take 40 mg by mouth daily as needed for edema. ) 30 tablet 1  . lisinopril (PRINIVIL,ZESTRIL) 5 MG tablet Take 1 tablet (5 mg total) by mouth daily. 30 tablet 3   No current facility-administered medications for this visit.     No Known Allergies  Review of Systems   Mild lower extremity edema following prostate surgery and fluid retention No chest pain or shortness of breath Chest incisions well-healed   BP (!) 150/90 (BP Location: Right Arm, Patient Position: Sitting, Cuff Size: Normal)   Pulse (!) 52   Resp 18   Ht 5\' 8"  (1.727 m)   Wt 211 lb 12.8 oz (96.1 kg)   SpO2 93% Comment: RA  BMI 32.20 kg/m  Physical Exam      Exam    General- alert and comfortable    Neck- no JVD, no cervical adenopathy palpable, no carotid bruit   Lungs- clear without rales, wheezes   Cor- regular rate and rhythm, no murmur , gallop   Abdomen- soft, non-tender   Extremities - warm, non-tender, minimal edema   Neuro- oriented, appropriate, no focal weakness  Diagnostic Tests: Chest x-ray image personally reviewed-clear.  Resolved left pleural effusion.  Impression: Patient doing well 3 months postop urgent CABG x4. He needs to remain on his aspirin, carvedilol, lisinopril and statin. 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   Len Childs, MD [facility] [phone]

## 2018-01-07 ENCOUNTER — Ambulatory Visit (HOSPITAL_COMMUNITY): Payer: 59

## 2018-01-10 ENCOUNTER — Ambulatory Visit (HOSPITAL_COMMUNITY): Payer: 59

## 2018-01-12 ENCOUNTER — Ambulatory Visit (HOSPITAL_COMMUNITY): Payer: 59

## 2018-01-14 ENCOUNTER — Ambulatory Visit (HOSPITAL_COMMUNITY): Payer: 59

## 2018-01-17 ENCOUNTER — Ambulatory Visit (HOSPITAL_COMMUNITY): Payer: 59

## 2018-01-19 ENCOUNTER — Ambulatory Visit (HOSPITAL_COMMUNITY): Payer: 59

## 2018-01-21 ENCOUNTER — Ambulatory Visit (HOSPITAL_COMMUNITY): Payer: 59

## 2018-01-24 ENCOUNTER — Other Ambulatory Visit: Payer: Self-pay | Admitting: Physician Assistant

## 2018-01-24 ENCOUNTER — Ambulatory Visit (HOSPITAL_COMMUNITY): Payer: 59

## 2018-01-25 ENCOUNTER — Other Ambulatory Visit: Payer: Self-pay | Admitting: *Deleted

## 2018-01-25 MED ORDER — LISINOPRIL 5 MG PO TABS: 5.0000 mg | ORAL_TABLET | Freq: Every day | ORAL | 6 refills | Status: DC

## 2018-01-25 MED FILL — LISINOPRIL 5 MG TABLET: 5 | 90 days supply | Qty: 90 | Fill #0

## 2018-01-25 MED FILL — CARVEDILOL 3.125 MG TABLET: 3.125 | 90 days supply | Qty: 180 | Fill #0

## 2018-01-25 NOTE — Telephone Encounter (Signed)
Rx has been sent to the pharmacy electronically. ° °

## 2018-01-25 NOTE — Telephone Encounter (Signed)
Rx request sent to pharmacy.  

## 2018-01-26 ENCOUNTER — Ambulatory Visit (HOSPITAL_COMMUNITY): Payer: 59

## 2018-01-26 DIAGNOSIS — Z0000 Encounter for general adult medical examination without abnormal findings: Secondary | ICD-10-CM | POA: Diagnosis not present

## 2018-01-26 DIAGNOSIS — Z1211 Encounter for screening for malignant neoplasm of colon: Secondary | ICD-10-CM | POA: Diagnosis not present

## 2018-01-26 DIAGNOSIS — E782 Mixed hyperlipidemia: Secondary | ICD-10-CM | POA: Diagnosis not present

## 2018-01-26 DIAGNOSIS — I10 Essential (primary) hypertension: Secondary | ICD-10-CM | POA: Diagnosis not present

## 2018-01-26 DIAGNOSIS — Z131 Encounter for screening for diabetes mellitus: Secondary | ICD-10-CM | POA: Diagnosis not present

## 2018-01-26 DIAGNOSIS — Z13220 Encounter for screening for lipoid disorders: Secondary | ICD-10-CM | POA: Diagnosis not present

## 2018-01-28 ENCOUNTER — Ambulatory Visit (HOSPITAL_COMMUNITY): Payer: 59

## 2018-01-31 ENCOUNTER — Ambulatory Visit (HOSPITAL_COMMUNITY): Payer: 59

## 2018-02-01 ENCOUNTER — Telehealth: Payer: Self-pay | Admitting: Cardiovascular Disease

## 2018-02-01 NOTE — Telephone Encounter (Signed)
New Message:     Please call, pt is there in the chair now. Pt had By Pass surgery in July of this year, wants to know if he can gget his teeth cleaned?

## 2018-02-01 NOTE — Telephone Encounter (Signed)
Spoke with dentist, aware patient is fine for teeth cleaning.

## 2018-02-02 ENCOUNTER — Ambulatory Visit (HOSPITAL_COMMUNITY): Payer: 59

## 2018-02-04 ENCOUNTER — Ambulatory Visit (HOSPITAL_COMMUNITY): Payer: 59

## 2018-02-07 ENCOUNTER — Ambulatory Visit (HOSPITAL_COMMUNITY): Payer: 59

## 2018-02-09 ENCOUNTER — Ambulatory Visit (HOSPITAL_COMMUNITY): Payer: 59

## 2018-02-11 ENCOUNTER — Ambulatory Visit (HOSPITAL_COMMUNITY): Payer: 59

## 2018-02-14 ENCOUNTER — Ambulatory Visit (HOSPITAL_COMMUNITY): Payer: 59

## 2018-02-16 ENCOUNTER — Ambulatory Visit (HOSPITAL_COMMUNITY): Payer: 59

## 2018-03-07 MED FILL — AMOXICILLIN 500 MG CAPSULE: 500 | 7 days supply | Qty: 28 | Fill #0

## 2018-03-07 MED FILL — traMADol HCL 50 MG TABS: 50 | 2 days supply | Qty: 10 | Fill #0

## 2018-03-21 DIAGNOSIS — C61 Malignant neoplasm of prostate: Secondary | ICD-10-CM | POA: Diagnosis not present

## 2018-03-21 MED FILL — ATORVASTATIN 80 MG TABLET: 80 | 90 days supply | Qty: 90 | Fill #1

## 2018-03-28 DIAGNOSIS — C61 Malignant neoplasm of prostate: Secondary | ICD-10-CM | POA: Diagnosis not present

## 2018-03-28 DIAGNOSIS — N393 Stress incontinence (female) (male): Secondary | ICD-10-CM | POA: Diagnosis not present

## 2018-03-28 DIAGNOSIS — N5231 Erectile dysfunction following radical prostatectomy: Secondary | ICD-10-CM | POA: Diagnosis not present

## 2018-05-09 MED FILL — LISINOPRIL 5 MG TABLET: 5 | 90 days supply | Qty: 90 | Fill #1 | Status: TO

## 2018-05-21 ENCOUNTER — Other Ambulatory Visit: Payer: Self-pay | Admitting: Cardiovascular Disease

## 2018-05-24 MED FILL — CARVEDILOL 3.125 MG TABLET: 3.125 | 90 days supply | Qty: 180 | Fill #0

## 2018-06-20 MED FILL — ATORVASTATIN 80 MG TABLET: 80 | 90 days supply | Qty: 90 | Fill #0

## 2018-06-21 DIAGNOSIS — C61 Malignant neoplasm of prostate: Secondary | ICD-10-CM | POA: Diagnosis not present

## 2018-06-28 DIAGNOSIS — N5231 Erectile dysfunction following radical prostatectomy: Secondary | ICD-10-CM | POA: Diagnosis not present

## 2018-06-28 DIAGNOSIS — C61 Malignant neoplasm of prostate: Secondary | ICD-10-CM | POA: Diagnosis not present

## 2018-06-28 DIAGNOSIS — N393 Stress incontinence (female) (male): Secondary | ICD-10-CM | POA: Diagnosis not present

## 2018-06-30 ENCOUNTER — Telehealth: Payer: Self-pay | Admitting: Cardiovascular Disease

## 2018-06-30 NOTE — Telephone Encounter (Signed)
Called x3 for pre reg/lvm

## 2018-07-01 ENCOUNTER — Telehealth (INDEPENDENT_AMBULATORY_CARE_PROVIDER_SITE_OTHER): Payer: 59 | Admitting: Cardiovascular Disease

## 2018-07-01 VITALS — BP 117/83 | Ht 68.0 in | Wt 220.0 lb

## 2018-07-01 DIAGNOSIS — I10 Essential (primary) hypertension: Secondary | ICD-10-CM

## 2018-07-01 DIAGNOSIS — I2510 Atherosclerotic heart disease of native coronary artery without angina pectoris: Secondary | ICD-10-CM | POA: Diagnosis not present

## 2018-07-01 DIAGNOSIS — C61 Malignant neoplasm of prostate: Secondary | ICD-10-CM

## 2018-07-01 DIAGNOSIS — E669 Obesity, unspecified: Secondary | ICD-10-CM

## 2018-07-01 DIAGNOSIS — E7800 Pure hypercholesterolemia, unspecified: Secondary | ICD-10-CM

## 2018-07-01 MED ORDER — ATORVASTATIN CALCIUM 20 MG PO TABS: 20.0000 mg | ORAL_TABLET | Freq: Every day | ORAL | 3 refills | Status: DC

## 2018-07-01 MED ORDER — COENZYME Q10 300 MG PO CAPS: ORAL_CAPSULE | ORAL | 3 refills | Status: DC

## 2018-07-01 MED FILL — ATORVASTATIN 20 MG TABLET: 20 | 90 days supply | Qty: 90 | Fill #0

## 2018-07-01 NOTE — Patient Instructions (Signed)
Medication Instructions:  Stop Atorvastatin for 1 month then restart Atorvastatin 20 mg daily along with Co Q10 300 mg daily  If you need a refill on your cardiac medications before your next appointment, please call your pharmacy.   Lab work: Mid July have a Lipid and Cmet done at our office [place]  No appointment needed Lab opens 8:00 am to 12:00 noon   Nothing to eat or drink after midnight   Testing/Procedures: None ordered  Follow-Up: At [facility], you and your health needs are our priority.  As part of our continuing mission to provide you with exceptional heart care, we have created designated Provider Care Teams.  These Care Teams include your primary Cardiologist (physician) and Advanced Practice Providers (APPs -  Physician Assistants and Nurse Practitioners) who all work together to provide you with the care you need, when you need it. . Schedule follow up with Dr.Croitoru in 12 months   Call 3 months before to schedule

## 2018-07-01 NOTE — Progress Notes (Signed)
Virtual Visit via Video Note   This visit type was conducted due to national recommendations for restrictions regarding the COVID-19 Pandemic (e.g. social distancing) in an effort to limit this patient's exposure and mitigate transmission in our community.  Due to his co-morbid illnesses, this patient is at least at moderate risk for complications without adequate follow up.  This format is felt to be most appropriate for this patient at this time.  All issues noted in this document were discussed and addressed.  A limited physical exam was performed with this format.  Please refer to the patient's chart for his consent to telehealth for [facility].   His wife Brendan Jackson participated in the call  Date:  07/01/2018   ID:  Brendan Jackson [birth year], MRN [id number]  Patient Location: Home Provider Location: Home  PCP:  Algis Greenhouse, MD  Cardiologist:  Sherren Mocha, MD  Electrophysiologist:  None   Evaluation Performed:  Follow-Up Visit  Chief Complaint:  CAD s/p CABG  History of Present Illness:    Brendan Jackson is a 68 y.o. male with CAD, presenting as non-ST segment elevation myocardial infarction in July 2019 leading to multivessel bypass surgery (LIMA to LAD, SVG to ramus intermedius, SVG to OM, SVG to PDA, Dr. Darcey Nora), systemic hypertension, prostate cancer status post surgery and with plan for radiation therapy.  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  He denies any angina or dyspnea with activity.  He does complain of some muscle cramps, that mostly happen at night in bed.  He is not describing intermittent claudication.  He wonders whether it could be a side effect from statin.  He is no longer taking any diuretics.  He has not had any neurological complaints, palpitations, dizziness or syncope or leg edema.  Follow-up echocardiogram performed in October  2019 showed complete resolution of mild left ventricular systolic dysfunction, now EF up to 55-60%. 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  He has mild biatrial dilation.  He has frequent urination ever since his prostate procedure, but is not having a lot of problems of leakage.  His PSA was low at 0.23, per his report.  The patient does not have symptoms concerning for COVID-19 infection (fever, chills, cough, or new shortness of breath).    Past Medical History:  Diagnosis Date  . Hyperlipidemia LDL goal <70   . Hypertension   . NSTEMI (non-ST elevated myocardial infarction) ([place]) 09/05/2017  . Prostate cancer [facility])    Past Surgical History:  Procedure Laterality Date  . CATARACT EXTRACTION  2015  . CORONARY ARTERY BYPASS GRAFT N/A 09/09/2017   Procedure: CORONARY ARTERY BYPASS GRAFTING (CABG) x 4, USING LEFT INTERNAL MAMMARY ARTERY AND RIGHT AND LEFT GREATER SAPHENOUS VEIN HARVESTED ENDOSCOPICALLY;  Surgeon: Ivin Poot, MD;  Location: [place];  Service: Open Heart Surgery;  Laterality: N/A;  . IR THORACENTESIS ASP PLEURAL SPACE W/IMG GUIDE  10/22/2017  . LEFT HEART CATH AND CORONARY ANGIOGRAPHY N/A 09/06/2017   Procedure: LEFT HEART CATH AND CORONARY ANGIOGRAPHY;  Surgeon: Lorretta Harp, MD;  Location: [place] CV LAB;  Service: Cardiovascular;  Laterality: N/A;  . LYMPHADENECTOMY Bilateral 12/15/2017   Procedure: LYMPHADENECTOMY;  Surgeon: Alexis Frock, MD;  Location: [facility];  Service: Urology;  Laterality: Bilateral;  . PROSTATE BIOPSY  05/13/2017  . ROBOT ASSISTED LAPAROSCOPIC RADICAL PROSTATECTOMY  N/A 12/15/2017   Procedure: XI ROBOTIC ASSISTED LAPAROSCOPIC RADICAL PROSTATECTOMY;  Surgeon: Alexis Frock, MD;  Location: [facility];  Service: Urology;  Laterality: N/A;  . TEE WITHOUT CARDIOVERSION N/A 09/09/2017   Procedure: TRANSESOPHAGEAL ECHOCARDIOGRAM (TEE);  Surgeon: Prescott Gum, Collier Salina, MD;  Location: [place];  Service: Open Heart Surgery;  Laterality: N/A;  .  TENDON REPAIR Right 1987   Thumb  . TONSILLECTOMY       Current Meds  Medication Sig  . acetaminophen (TYLENOL) 325 MG tablet Take 2 tablets (650 mg total) by mouth every 6 (six) hours as needed for mild pain.  Marland Kitchen atorvastatin (LIPITOR) 80 MG tablet Take 1 tablet (80 mg total) by mouth daily at 6 PM.  . carvedilol (COREG) 3.125 MG tablet TAKE 1 TABLET BY MOUTH 2 TIMES DAILY WITH A MEAL.  . furosemide (LASIX) 40 MG tablet Take 1 tablet (40 mg total) by mouth daily. (Patient taking differently: Take 40 mg by mouth daily as needed for edema. )  . lisinopril (PRINIVIL,ZESTRIL) 5 MG tablet Take 1 tablet (5 mg total) by mouth daily.     Allergies:   Patient has no known allergies.   Social History   Tobacco Use  . Smoking status: Never Smoker  . Smokeless tobacco: Former Systems developer    Types: Chew  Substance Use Topics  . Alcohol use: Yes    Alcohol/week: 1.0 standard drinks    Types: 1 Cans of beer per week    Frequency: Never    Comment: occas  . Drug use: Never     Family Hx: The patient's family history includes Cancer in his father, paternal aunt, paternal aunt, paternal uncle, and paternal uncle.  ROS:   Please see the history of present illness.     All other systems reviewed and are negative.   Prior CV studies:   The following studies were reviewed today:  Echo December 07, 2017  Labs/Other Tests and Data Reviewed:    EKG:  An ECG dated 09/30/2017 was personally reviewed today and demonstrated:  Sinus rhythm, right bundle branch block  Recent Labs: 09/07/2017: TSH 4.336 09/10/2017: Magnesium 2.2 10/28/2017: ALT 20 12/10/2017: Platelets 210 12/16/2017: BUN 19; Creatinine, Ser 1.07; Hemoglobin 13.5; Potassium 4.5; Sodium 138   Recent Lipid Panel Lab Results  Component Value Date/Time   CHOL 125 10/28/2017 08:35 AM   TRIG 48 10/28/2017 08:35 AM   HDL 43 10/28/2017 08:35 AM   CHOLHDL 2.9 10/28/2017 08:35 AM   CHOLHDL 4.2 09/06/2017 02:18 AM   LDLCALC 72 10/28/2017  08:35 AM    Wt Readings from Last 3 Encounters:  07/01/18 220 lb (99.8 kg)  01/05/18 211 lb 12.8 oz (96.1 kg)  12/15/17 212 lb 1.3 oz (96.2 kg)     Objective:    Vital Signs:  BP 117/83   Ht 5\' 8"  (1.727 m)   Wt 220 lb (99.8 kg)   BMI 33.45 kg/m    VITAL SIGNS:  reviewed GEN:  no acute distress EYES:  sclerae anicteric, EOMI - Extraocular Movements Intact RESPIRATORY:  normal respiratory effort, symmetric expansion CARDIOVASCULAR:  no peripheral edema SKIN:  no rash, lesions or ulcers. MUSCULOSKELETAL:  no obvious deformities. NEURO:  alert and oriented x 3, no obvious focal deficit PSYCH:  normal affect  ASSESSMENT & PLAN:    1. CAD s/p CABG: Excellent recovery following bypass surgery.  Left ventricular systolic function has returned to normal.  He does not have angina despite being very physically active. 2. HLP:  Not sure whether his leg cramps are due to statin therapy but it is reasonable for him to have a statin holiday.  We will stop the atorvastatin completely for 1 month and when restarting it will take the lower dose of 20 mg once daily as well as coenzyme Q 10 supplements, then we will repeat his lipid profile.  Target LDL less than 70.  If he has recurrent complaints, alternatives include using a water-soluble statin such as rosuvastatin or even switching to Repatha.  I also encouraged him to take magnesium oxide 400 mg supplements once daily and drink more fluids. 3. HTN: Well-controlled 4. Obesity: He remains mildly obese and we reviewed ways to limit his intake of calories.  He is very physically active.  COVID-19 Education: The signs and symptoms of COVID-19 were discussed with the patient and how to seek care for testing (follow up with PCP or arrange E-visit).  The importance of social distancing was discussed today.  Time:   Today, I have spent 16 minutes with the patient with telehealth technology discussing the above problems.     Medication  Adjustments/Labs and Tests Ordered: Current medicines are reviewed at length with the patient today.  Concerns regarding medicines are outlined above.   Tests Ordered: No orders of the defined types were placed in this encounter.   Medication Changes: No orders of the defined types were placed in this encounter.   Disposition:  Follow up 12 months  Signed, Sanda Klein, MD  07/01/2018 9:27 AM    [place]

## 2018-07-12 ENCOUNTER — Ambulatory Visit
Admission: RE | Admit: 2018-07-12 | Discharge: 2018-07-12 | Disposition: A | Discharge status: Home / Self Care | Payer: 59 | Source: Ambulatory Visit | Attending: Radiation Oncology | Admitting: Radiation Oncology

## 2018-07-12 ENCOUNTER — Other Ambulatory Visit: Payer: Self-pay

## 2018-07-12 ENCOUNTER — Ambulatory Visit
Admission: RE | Admit: 2018-07-12 | Discharge: 2018-07-12 | Disposition: A | Discharge status: Home / Self Care | Payer: 59 | Source: Ambulatory Visit | Attending: Urology | Admitting: Urology

## 2018-07-12 ENCOUNTER — Encounter: Payer: Self-pay | Admitting: Urology

## 2018-07-12 VITALS — Ht 68.0 in | Wt 215.0 lb

## 2018-07-12 DIAGNOSIS — C61 Malignant neoplasm of prostate: Secondary | ICD-10-CM

## 2018-07-12 DIAGNOSIS — R9721 Rising PSA following treatment for malignant neoplasm of prostate: Secondary | ICD-10-CM | POA: Diagnosis not present

## 2018-07-12 DIAGNOSIS — Z9079 Acquired absence of other genital organ(s): Secondary | ICD-10-CM | POA: Diagnosis not present

## 2018-07-12 NOTE — Progress Notes (Signed)
See progress note under physician encounter. 

## 2018-07-12 NOTE — Progress Notes (Signed)
Radiation Oncology         [phone] ________________________________  Name: Brendan Jackson MRN: [id number]  Date: 07/12/2018  DOB: 06-16-1950  Follow-Up New Visit Note - Conducted via telephone due to current COVID-19 concerns for limiting patient exposure  CC: Brendan Jackson, Brendan Graff, Brendan Jackson  Alexis Frock, Brendan Jackson  Diagnosis:   68 y.o. 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    ICD-10-CM   1. Malignant neoplasm of prostate ([place]) C61    Narrative:  The patient returns today for re-evaluation of his prostate cancer, initially diagnosed in March 2019 as Gleason 4+3. He was seen in our clinic in April 2019 for consideration of radiation treatment options, and he ultimately elected to undergo radical prostatectomy with bilateral pelvic lymphadenectomy on 12/15/2017 under the care and direction of Dr. Tresa Moore.  Final pathology revealed stage pT3a, Gleason 4+5 prostatic adenocarcinoma with extraprostatic extension at the right posterolateral form the apex to the base. Seminal vesicles, vasa deferentia, all margins of resection, and all lymph nodes were negative. Post-op PSA in January 2020 remained detectable at 0.128 and rose to 0.24 in April 2020.        The patient reviewed PSA results with Dr. Tresa Moore and has kindly been referred today for discussion of adjuvant radiation treatment.  On review of systems, the patient reports mild stress urinary incontinence since surgery requiring 1 pad per day when he is working but otherwise has regained good bladder control and is pleased. He has continued PT exercises and is making good progress in terms of continence recovery.  He denies dysuria, gross hematuria, excessive daytime frequency, urgency or incomplete bladder emptying.  He reports a healthy appetite and denies abdominal pain, nausea, vomiting, diarrhea or constipation.  He has not had recent fevers, chills, night sweats or  unintended weight loss.  He reports erectile dysfunction and scheduled for a trial of Trimix injection 07/14/18.    ALLERGIES:  has No Known Allergies.  Meds: Current Outpatient Medications  Medication Sig Dispense Refill   acetaminophen (TYLENOL) 325 MG tablet Take 2 tablets (650 mg total) by mouth every 6 (six) hours as needed for mild pain.     aspirin EC 81 MG tablet Take 81 mg by mouth daily.     carvedilol (COREG) 3.125 MG tablet TAKE 1 TABLET BY MOUTH 2 TIMES DAILY WITH A MEAL. 180 tablet 0   lisinopril (PRINIVIL,ZESTRIL) 5 MG tablet Take 1 tablet (5 mg total) by mouth daily. 30 tablet 6   atorvastatin (LIPITOR) 20 MG tablet Take 1 tablet (20 mg total) by mouth daily. (Patient not taking: Reported on 07/12/2018) 90 tablet 3   Coenzyme Q10 (EQL COQ10) 300 MG CAPS Take 300 mg daily (Patient not taking: Reported on 07/12/2018) 90 capsule 3   No current facility-administered medications for this encounter.     Physical Findings: Physical exam not performed in light of telephone/telehealth visit format.   height is 5\' 8"  (1.727 m) and weight is 215 lb (97.5 kg).   Lab Findings: Lab Results  Component Value Date   WBC 6.2 12/10/2017   HGB 13.5 12/16/2017   HCT 42.5 12/16/2017   PLT 210 12/10/2017    Lab Results  Component Value Date   NA 138 12/16/2017   NA 141 10/28/2017   K 4.5 12/16/2017   CO2 25 12/16/2017   GLUCOSE 144 (H) 12/16/2017   BUN 19 12/16/2017   BUN 14 10/28/2017  CREATININE 1.07 12/16/2017   BILITOT 0.4 10/28/2017   ALKPHOS 101 10/28/2017   AST 22 10/28/2017   ALT 20 10/28/2017   PROT 6.7 10/28/2017   ALBUMIN 4.0 10/28/2017   CALCIUM 8.9 12/16/2017   ANIONGAP 7 12/16/2017    Radiographic Findings: No results found.  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 Today we reviewed the findings and workup thus far.  We discussed the natural history of  prostate cancer.  We reviewed the implications of a rising, detectable postoperative PSA in the setting of Gleason 4+5 disease with evidence of extracapsular extension on the risk of prostate cancer recurrence. We reviewed some of the evidence suggesting an advantage for patients who undergo adjuvant radiotherapy in the setting in terms of disease control and overall survival. We discussed radiation treatment directed to the prostatic fossa with regard to the logistics and delivery of external beam radiation treatment.  The recommendation is to proceed with a 7-1/2-week course of daily radiotherapy to the prostate fossa and surrounding pelvic lymph nodes in combination with ST-ADT.  We reviewed the acute and long-term side effects associated with radiotherapy.  We also detailed the role of ADT in the treatment of high risk prostate cancer and outlined the associated side effects that could be expected with this therapy.  The patient and his wife were encouraged to ask questions that were answered to their stated satisfaction.  Plan: At the conclusion of our conversation, the patient elects to proceed with 7.5 weeks of adjuvant external beam radiation treatment directed to the prostatic fossa in combination with ST-ADT. The patient has a scheduled follow-up with Dr. Tresa Moore on Thursday May 14th for Trimix injection training, and would like to start ADT at that time as well if possible. We will share this discussion with Dr. Tresa Moore and move forward with treatment planning in anticipation of beginning adjuvant prostate fossa radiotherapy in the near future.  We discussed moving forward with radiotherapy shortly after the start of ADT versus postponing the start of radiation until June or July in light of the current concerns for COVID-19.  He will give this some thought and let us know his preference within the next week so that we can coordinate his [facility] accordingly.   Given current concerns for patient exposure  during the COVID-19 pandemic, this encounter was conducted via telephone. The patient was notified in advance and was offered a [place] meeting to allow for face to face communication but unfortunately reported that he did not have the appropriate resources/technology to support such a visit and instead preferred to proceed with telephone consult.  The patient has given verbal consent for this type of encounter. The time spent during this encounter was 45 minutes. The attendants for this meeting include Tyler Pita Brendan Jackson, Ashlyn Bruning PA-C, [place], patient Kyle Luppino and his wife. During the encounter, Tyler Pita Brendan Jackson, Ashlyn Bruning PA-C, and scribe, Rae Lips were located at [facility] Radiation Oncology Department.  Patient Davone Shinault and wife were located at home.    Nicholos Johns, PA-C    Tyler Pita, Brendan Jackson  [facility] Direct Dial: [phone]   Fax: [phone] [email or web address]   Skype   [email or web address]  This document serves as a record of services personally performed by Tyler Pita, Brendan Jackson and Freeman Caldron, PA-C. It was created on their behalf by [facility]  Jasmine December, a trained Presenter, broadcasting. The creation of this record is based on the scribe's personal observations and the providers' statements to them. This document has been checked and approved by the attending providers.

## 2018-07-13 ENCOUNTER — Telehealth: Payer: Self-pay | Admitting: *Deleted

## 2018-07-13 NOTE — Telephone Encounter (Signed)
Called patient to inform of ADT Inj. for 07-14-18 - arrival time- 10:30 am @ Dr. Zettie Pho office, spoke with patient and he is aware of this appt.

## 2018-07-13 NOTE — Telephone Encounter (Signed)
Called patient to inform of sim appt. for 08-05-18 - arrival time - 7:45 am @ [facility], spoke with patient and he is aware of this appt.

## 2018-07-14 DIAGNOSIS — C61 Malignant neoplasm of prostate: Secondary | ICD-10-CM | POA: Diagnosis not present

## 2018-07-14 DIAGNOSIS — N5231 Erectile dysfunction following radical prostatectomy: Secondary | ICD-10-CM | POA: Diagnosis not present

## 2018-07-14 DIAGNOSIS — N393 Stress incontinence (female) (male): Secondary | ICD-10-CM | POA: Diagnosis not present

## 2018-07-14 MED FILL — BICALUTAMIDE 50 MG TABS: 50 | 30 days supply | Qty: 30 | Fill #0

## 2018-07-29 DIAGNOSIS — Z5111 Encounter for antineoplastic chemotherapy: Secondary | ICD-10-CM | POA: Diagnosis not present

## 2018-07-29 DIAGNOSIS — C61 Malignant neoplasm of prostate: Secondary | ICD-10-CM | POA: Diagnosis not present

## 2018-08-05 ENCOUNTER — Other Ambulatory Visit: Payer: Self-pay

## 2018-08-05 ENCOUNTER — Ambulatory Visit
Admission: RE | Admit: 2018-08-05 | Discharge: 2018-08-05 | Disposition: A | Discharge status: Home / Self Care | Payer: 59 | Source: Ambulatory Visit | Attending: Radiation Oncology | Admitting: Radiation Oncology

## 2018-08-05 DIAGNOSIS — Z510 Encounter for antineoplastic radiation therapy: Secondary | ICD-10-CM | POA: Insufficient documentation

## 2018-08-05 DIAGNOSIS — C61 Malignant neoplasm of prostate: Secondary | ICD-10-CM | POA: Diagnosis not present

## 2018-08-05 NOTE — Progress Notes (Signed)
  Radiation Oncology         [phone] ________________________________  Name: Brendan Jackson MRN: [id number]  Date: 08/05/2018  DOB: [birth year]  SIMULATION AND TREATMENT PLANNING NOTE    ICD-10-CM   1. Malignant neoplasm of prostate ([place]) C61   2. Prostate cancer ([place]) C61     DIAGNOSIS:  68 y.o. 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  NARRATIVE:  The patient was brought to the [place].  Identity was confirmed.  All relevant records and images related to the planned course of therapy were reviewed.  The patient freely provided informed written consent to proceed with treatment after reviewing the details related to the planned course of therapy. The consent form was witnessed and verified by the simulation staff.  Then, the patient was set-up in a stable reproducible supine position for radiation therapy.  A vacuum lock pillow device was custom fabricated to position his legs in a reproducible immobilized position.  Then, I performed a urethrogram under sterile conditions to identify the prostatic bed.  CT images were obtained.  Surface markings were placed.  The CT images were loaded into the planning software.  Then the prostate bed target, pelvic lymph node target and avoidance structures including the rectum, bladder, bowel and hips were contoured.  Treatment planning then occurred.  The radiation prescription was entered and confirmed.  A total of one complex treatment devices were fabricated. I have requested : Intensity Modulated Radiotherapy (IMRT) is medically necessary for this case for the following reason:  Rectal sparing.Marland Kitchen  PLAN:  The patient will receive 45 Gy in 25 fractions of 1.8 Gy, followed by a boost to the prostate bed to a total dose of 68.4 Gy with 13 additional fractions of 1.8 Gy.   ________________________________  Sheral Apley Tammi Klippel, M.D.   This document  serves as a record of services personally performed by Tyler Pita, MD. It was created on his behalf by Wilburn Mylar, a trained medical scribe. The creation of this record is based on the scribe's personal observations and the provider's statements to them. This document has been checked and approved by the attending provider.

## 2018-08-08 MED FILL — LISINOPRIL 5 MG TABLET: 5 | 30 days supply | Qty: 30 | Fill #0

## 2018-08-10 DIAGNOSIS — C61 Malignant neoplasm of prostate: Secondary | ICD-10-CM | POA: Diagnosis not present

## 2018-08-10 DIAGNOSIS — Z510 Encounter for antineoplastic radiation therapy: Secondary | ICD-10-CM | POA: Diagnosis not present

## 2018-08-16 ENCOUNTER — Ambulatory Visit: Payer: 59

## 2018-08-16 ENCOUNTER — Other Ambulatory Visit: Payer: Self-pay

## 2018-08-16 DIAGNOSIS — Z510 Encounter for antineoplastic radiation therapy: Secondary | ICD-10-CM | POA: Diagnosis not present

## 2018-08-16 DIAGNOSIS — C61 Malignant neoplasm of prostate: Secondary | ICD-10-CM | POA: Diagnosis not present

## 2018-08-17 ENCOUNTER — Ambulatory Visit
Admission: RE | Admit: 2018-08-17 | Discharge: 2018-08-17 | Disposition: A | Discharge status: Home / Self Care | Payer: 59 | Source: Ambulatory Visit | Attending: Radiation Oncology | Admitting: Radiation Oncology

## 2018-08-17 ENCOUNTER — Other Ambulatory Visit: Payer: Self-pay

## 2018-08-17 DIAGNOSIS — Z510 Encounter for antineoplastic radiation therapy: Secondary | ICD-10-CM | POA: Diagnosis not present

## 2018-08-17 DIAGNOSIS — C61 Malignant neoplasm of prostate: Secondary | ICD-10-CM | POA: Diagnosis not present

## 2018-08-18 ENCOUNTER — Ambulatory Visit
Admission: RE | Admit: 2018-08-18 | Discharge: 2018-08-18 | Disposition: A | Discharge status: Home / Self Care | Payer: 59 | Source: Ambulatory Visit | Attending: Radiation Oncology | Admitting: Radiation Oncology

## 2018-08-18 DIAGNOSIS — C61 Malignant neoplasm of prostate: Secondary | ICD-10-CM | POA: Diagnosis not present

## 2018-08-18 DIAGNOSIS — Z510 Encounter for antineoplastic radiation therapy: Secondary | ICD-10-CM | POA: Diagnosis not present

## 2018-08-19 ENCOUNTER — Ambulatory Visit
Admission: RE | Admit: 2018-08-19 | Discharge: 2018-08-19 | Disposition: A | Discharge status: Home / Self Care | Payer: 59 | Source: Ambulatory Visit | Attending: Radiation Oncology | Admitting: Radiation Oncology

## 2018-08-19 ENCOUNTER — Other Ambulatory Visit: Payer: Self-pay

## 2018-08-19 DIAGNOSIS — Z510 Encounter for antineoplastic radiation therapy: Secondary | ICD-10-CM | POA: Diagnosis not present

## 2018-08-19 DIAGNOSIS — C61 Malignant neoplasm of prostate: Secondary | ICD-10-CM | POA: Diagnosis not present

## 2018-08-22 ENCOUNTER — Other Ambulatory Visit: Payer: Self-pay

## 2018-08-22 ENCOUNTER — Ambulatory Visit
Admission: RE | Admit: 2018-08-22 | Discharge: 2018-08-22 | Disposition: A | Discharge status: Home / Self Care | Payer: 59 | Source: Ambulatory Visit | Attending: Radiation Oncology | Admitting: Radiation Oncology

## 2018-08-22 DIAGNOSIS — C61 Malignant neoplasm of prostate: Secondary | ICD-10-CM | POA: Diagnosis not present

## 2018-08-22 DIAGNOSIS — Z510 Encounter for antineoplastic radiation therapy: Secondary | ICD-10-CM | POA: Diagnosis not present

## 2018-08-23 ENCOUNTER — Ambulatory Visit
Admission: RE | Admit: 2018-08-23 | Discharge: 2018-08-23 | Disposition: A | Discharge status: Home / Self Care | Payer: 59 | Source: Ambulatory Visit | Attending: Radiation Oncology | Admitting: Radiation Oncology

## 2018-08-23 ENCOUNTER — Other Ambulatory Visit: Payer: Self-pay

## 2018-08-23 DIAGNOSIS — C61 Malignant neoplasm of prostate: Secondary | ICD-10-CM | POA: Diagnosis not present

## 2018-08-23 DIAGNOSIS — Z510 Encounter for antineoplastic radiation therapy: Secondary | ICD-10-CM | POA: Diagnosis not present

## 2018-08-24 ENCOUNTER — Other Ambulatory Visit: Payer: Self-pay

## 2018-08-24 ENCOUNTER — Ambulatory Visit
Admission: RE | Admit: 2018-08-24 | Discharge: 2018-08-24 | Disposition: A | Discharge status: Home / Self Care | Payer: 59 | Source: Ambulatory Visit | Attending: Radiation Oncology | Admitting: Radiation Oncology

## 2018-08-24 DIAGNOSIS — Z510 Encounter for antineoplastic radiation therapy: Secondary | ICD-10-CM | POA: Diagnosis not present

## 2018-08-24 DIAGNOSIS — C61 Malignant neoplasm of prostate: Secondary | ICD-10-CM | POA: Diagnosis not present

## 2018-08-25 ENCOUNTER — Ambulatory Visit
Admission: RE | Admit: 2018-08-25 | Discharge: 2018-08-25 | Disposition: A | Discharge status: Home / Self Care | Payer: 59 | Source: Ambulatory Visit | Attending: Radiation Oncology | Admitting: Radiation Oncology

## 2018-08-25 ENCOUNTER — Other Ambulatory Visit: Payer: Self-pay

## 2018-08-25 DIAGNOSIS — C61 Malignant neoplasm of prostate: Secondary | ICD-10-CM | POA: Diagnosis not present

## 2018-08-25 DIAGNOSIS — Z510 Encounter for antineoplastic radiation therapy: Secondary | ICD-10-CM | POA: Diagnosis not present

## 2018-08-26 ENCOUNTER — Other Ambulatory Visit: Payer: Self-pay

## 2018-08-26 ENCOUNTER — Ambulatory Visit
Admission: RE | Admit: 2018-08-26 | Discharge: 2018-08-26 | Disposition: A | Discharge status: Home / Self Care | Payer: 59 | Source: Ambulatory Visit | Attending: Radiation Oncology | Admitting: Radiation Oncology

## 2018-08-26 DIAGNOSIS — C61 Malignant neoplasm of prostate: Secondary | ICD-10-CM | POA: Diagnosis not present

## 2018-08-26 DIAGNOSIS — Z510 Encounter for antineoplastic radiation therapy: Secondary | ICD-10-CM | POA: Diagnosis not present

## 2018-08-29 ENCOUNTER — Other Ambulatory Visit: Payer: Self-pay

## 2018-08-29 ENCOUNTER — Other Ambulatory Visit: Payer: Self-pay | Admitting: Cardiovascular Disease

## 2018-08-29 ENCOUNTER — Ambulatory Visit
Admission: RE | Admit: 2018-08-29 | Discharge: 2018-08-29 | Disposition: A | Discharge status: Home / Self Care | Payer: 59 | Source: Ambulatory Visit | Attending: Radiation Oncology | Admitting: Radiation Oncology

## 2018-08-29 DIAGNOSIS — C61 Malignant neoplasm of prostate: Secondary | ICD-10-CM | POA: Diagnosis not present

## 2018-08-29 DIAGNOSIS — Z510 Encounter for antineoplastic radiation therapy: Secondary | ICD-10-CM | POA: Diagnosis not present

## 2018-08-30 ENCOUNTER — Other Ambulatory Visit: Payer: Self-pay

## 2018-08-30 ENCOUNTER — Ambulatory Visit
Admission: RE | Admit: 2018-08-30 | Discharge: 2018-08-30 | Disposition: A | Discharge status: Home / Self Care | Payer: 59 | Source: Ambulatory Visit | Attending: Radiation Oncology | Admitting: Radiation Oncology

## 2018-08-30 DIAGNOSIS — Z510 Encounter for antineoplastic radiation therapy: Secondary | ICD-10-CM | POA: Diagnosis not present

## 2018-08-30 DIAGNOSIS — C61 Malignant neoplasm of prostate: Secondary | ICD-10-CM | POA: Diagnosis not present

## 2018-08-31 ENCOUNTER — Ambulatory Visit
Admission: RE | Admit: 2018-08-31 | Discharge: 2018-08-31 | Disposition: A | Discharge status: Home / Self Care | Payer: 59 | Source: Ambulatory Visit | Attending: Radiation Oncology | Admitting: Radiation Oncology

## 2018-08-31 ENCOUNTER — Other Ambulatory Visit: Payer: Self-pay

## 2018-08-31 DIAGNOSIS — Z510 Encounter for antineoplastic radiation therapy: Secondary | ICD-10-CM | POA: Diagnosis not present

## 2018-08-31 DIAGNOSIS — C61 Malignant neoplasm of prostate: Secondary | ICD-10-CM | POA: Diagnosis not present

## 2018-09-01 ENCOUNTER — Other Ambulatory Visit: Payer: Self-pay

## 2018-09-01 ENCOUNTER — Ambulatory Visit
Admission: RE | Admit: 2018-09-01 | Discharge: 2018-09-01 | Disposition: A | Discharge status: Home / Self Care | Payer: 59 | Source: Ambulatory Visit | Attending: Radiation Oncology | Admitting: Radiation Oncology

## 2018-09-01 DIAGNOSIS — Z510 Encounter for antineoplastic radiation therapy: Secondary | ICD-10-CM | POA: Diagnosis not present

## 2018-09-01 DIAGNOSIS — C61 Malignant neoplasm of prostate: Secondary | ICD-10-CM | POA: Diagnosis not present

## 2018-09-02 MED FILL — LISINOPRIL 5 MG TABLET: 5 | 30 days supply | Qty: 30 | Fill #0

## 2018-09-05 ENCOUNTER — Other Ambulatory Visit: Payer: Self-pay

## 2018-09-05 ENCOUNTER — Ambulatory Visit
Admission: RE | Admit: 2018-09-05 | Discharge: 2018-09-05 | Disposition: A | Discharge status: Home / Self Care | Payer: 59 | Source: Ambulatory Visit | Attending: Radiation Oncology | Admitting: Radiation Oncology

## 2018-09-05 DIAGNOSIS — Z510 Encounter for antineoplastic radiation therapy: Secondary | ICD-10-CM | POA: Diagnosis not present

## 2018-09-05 DIAGNOSIS — C61 Malignant neoplasm of prostate: Secondary | ICD-10-CM | POA: Diagnosis not present

## 2018-09-06 ENCOUNTER — Ambulatory Visit
Admission: RE | Admit: 2018-09-06 | Discharge: 2018-09-06 | Disposition: A | Discharge status: Home / Self Care | Payer: 59 | Source: Ambulatory Visit | Attending: Radiation Oncology | Admitting: Radiation Oncology

## 2018-09-06 ENCOUNTER — Other Ambulatory Visit: Payer: Self-pay

## 2018-09-06 DIAGNOSIS — C61 Malignant neoplasm of prostate: Secondary | ICD-10-CM | POA: Diagnosis not present

## 2018-09-06 DIAGNOSIS — Z510 Encounter for antineoplastic radiation therapy: Secondary | ICD-10-CM | POA: Diagnosis not present

## 2018-09-07 ENCOUNTER — Other Ambulatory Visit: Payer: Self-pay

## 2018-09-07 ENCOUNTER — Ambulatory Visit
Admission: RE | Admit: 2018-09-07 | Discharge: 2018-09-07 | Disposition: A | Discharge status: Home / Self Care | Payer: 59 | Source: Ambulatory Visit | Attending: Radiation Oncology | Admitting: Radiation Oncology

## 2018-09-07 DIAGNOSIS — Z510 Encounter for antineoplastic radiation therapy: Secondary | ICD-10-CM | POA: Diagnosis not present

## 2018-09-07 DIAGNOSIS — C61 Malignant neoplasm of prostate: Secondary | ICD-10-CM | POA: Diagnosis not present

## 2018-09-08 ENCOUNTER — Other Ambulatory Visit: Payer: Self-pay

## 2018-09-08 ENCOUNTER — Ambulatory Visit
Admission: RE | Admit: 2018-09-08 | Discharge: 2018-09-08 | Disposition: A | Discharge status: Home / Self Care | Payer: 59 | Source: Ambulatory Visit | Attending: Radiation Oncology | Admitting: Radiation Oncology

## 2018-09-08 DIAGNOSIS — C61 Malignant neoplasm of prostate: Secondary | ICD-10-CM | POA: Diagnosis not present

## 2018-09-08 DIAGNOSIS — Z510 Encounter for antineoplastic radiation therapy: Secondary | ICD-10-CM | POA: Diagnosis not present

## 2018-09-09 ENCOUNTER — Ambulatory Visit
Admission: RE | Admit: 2018-09-09 | Discharge: 2018-09-09 | Disposition: A | Discharge status: Home / Self Care | Payer: 59 | Source: Ambulatory Visit | Attending: Radiation Oncology | Admitting: Radiation Oncology

## 2018-09-09 ENCOUNTER — Other Ambulatory Visit: Payer: Self-pay

## 2018-09-09 DIAGNOSIS — C61 Malignant neoplasm of prostate: Secondary | ICD-10-CM | POA: Diagnosis not present

## 2018-09-09 DIAGNOSIS — Z510 Encounter for antineoplastic radiation therapy: Secondary | ICD-10-CM | POA: Diagnosis not present

## 2018-09-12 ENCOUNTER — Other Ambulatory Visit: Payer: Self-pay | Admitting: Cardiovascular Disease

## 2018-09-12 ENCOUNTER — Ambulatory Visit
Admission: RE | Admit: 2018-09-12 | Discharge: 2018-09-12 | Disposition: A | Discharge status: Home / Self Care | Payer: 59 | Source: Ambulatory Visit | Attending: Radiation Oncology | Admitting: Radiation Oncology

## 2018-09-12 DIAGNOSIS — Z510 Encounter for antineoplastic radiation therapy: Secondary | ICD-10-CM | POA: Diagnosis not present

## 2018-09-12 DIAGNOSIS — C61 Malignant neoplasm of prostate: Secondary | ICD-10-CM | POA: Diagnosis not present

## 2018-09-12 MED FILL — CARVEDILOL 3.125 MG TABLET: 3.125 | 90 days supply | Qty: 180 | Fill #0

## 2018-09-13 ENCOUNTER — Ambulatory Visit
Admission: RE | Admit: 2018-09-13 | Discharge: 2018-09-13 | Disposition: A | Discharge status: Home / Self Care | Payer: 59 | Source: Ambulatory Visit | Attending: Radiation Oncology | Admitting: Radiation Oncology

## 2018-09-13 ENCOUNTER — Other Ambulatory Visit: Payer: Self-pay

## 2018-09-13 DIAGNOSIS — Z510 Encounter for antineoplastic radiation therapy: Secondary | ICD-10-CM | POA: Diagnosis not present

## 2018-09-13 DIAGNOSIS — C61 Malignant neoplasm of prostate: Secondary | ICD-10-CM | POA: Diagnosis not present

## 2018-09-14 ENCOUNTER — Other Ambulatory Visit: Payer: Self-pay

## 2018-09-14 ENCOUNTER — Ambulatory Visit
Admission: RE | Admit: 2018-09-14 | Discharge: 2018-09-14 | Disposition: A | Discharge status: Home / Self Care | Payer: 59 | Source: Ambulatory Visit | Attending: Radiation Oncology | Admitting: Radiation Oncology

## 2018-09-14 DIAGNOSIS — C61 Malignant neoplasm of prostate: Secondary | ICD-10-CM | POA: Diagnosis not present

## 2018-09-14 DIAGNOSIS — Z510 Encounter for antineoplastic radiation therapy: Secondary | ICD-10-CM | POA: Diagnosis not present

## 2018-09-15 ENCOUNTER — Other Ambulatory Visit: Payer: Self-pay

## 2018-09-15 ENCOUNTER — Ambulatory Visit
Admission: RE | Admit: 2018-09-15 | Discharge: 2018-09-15 | Disposition: A | Discharge status: Home / Self Care | Payer: 59 | Source: Ambulatory Visit | Attending: Radiation Oncology | Admitting: Radiation Oncology

## 2018-09-15 DIAGNOSIS — Z510 Encounter for antineoplastic radiation therapy: Secondary | ICD-10-CM | POA: Diagnosis not present

## 2018-09-15 DIAGNOSIS — C61 Malignant neoplasm of prostate: Secondary | ICD-10-CM | POA: Diagnosis not present

## 2018-09-16 ENCOUNTER — Ambulatory Visit
Admission: RE | Admit: 2018-09-16 | Discharge: 2018-09-16 | Disposition: A | Discharge status: Home / Self Care | Payer: 59 | Source: Ambulatory Visit | Attending: Radiation Oncology | Admitting: Radiation Oncology

## 2018-09-16 ENCOUNTER — Other Ambulatory Visit: Payer: Self-pay

## 2018-09-16 DIAGNOSIS — Z510 Encounter for antineoplastic radiation therapy: Secondary | ICD-10-CM | POA: Diagnosis not present

## 2018-09-16 DIAGNOSIS — C61 Malignant neoplasm of prostate: Secondary | ICD-10-CM | POA: Diagnosis not present

## 2018-09-19 ENCOUNTER — Ambulatory Visit
Admission: RE | Admit: 2018-09-19 | Discharge: 2018-09-19 | Disposition: A | Discharge status: Home / Self Care | Payer: 59 | Source: Ambulatory Visit | Attending: Radiation Oncology | Admitting: Radiation Oncology

## 2018-09-19 ENCOUNTER — Other Ambulatory Visit: Payer: Self-pay

## 2018-09-19 DIAGNOSIS — Z510 Encounter for antineoplastic radiation therapy: Secondary | ICD-10-CM | POA: Diagnosis not present

## 2018-09-19 DIAGNOSIS — C61 Malignant neoplasm of prostate: Secondary | ICD-10-CM | POA: Diagnosis not present

## 2018-09-20 ENCOUNTER — Other Ambulatory Visit: Payer: Self-pay

## 2018-09-20 ENCOUNTER — Ambulatory Visit
Admission: RE | Admit: 2018-09-20 | Discharge: 2018-09-20 | Disposition: A | Discharge status: Home / Self Care | Payer: 59 | Source: Ambulatory Visit | Attending: Radiation Oncology | Admitting: Radiation Oncology

## 2018-09-20 DIAGNOSIS — C61 Malignant neoplasm of prostate: Secondary | ICD-10-CM | POA: Diagnosis not present

## 2018-09-20 DIAGNOSIS — Z510 Encounter for antineoplastic radiation therapy: Secondary | ICD-10-CM | POA: Diagnosis not present

## 2018-09-21 ENCOUNTER — Other Ambulatory Visit: Payer: Self-pay

## 2018-09-21 ENCOUNTER — Ambulatory Visit
Admission: RE | Admit: 2018-09-21 | Discharge: 2018-09-21 | Disposition: A | Discharge status: Home / Self Care | Payer: 59 | Source: Ambulatory Visit | Attending: Radiation Oncology | Admitting: Radiation Oncology

## 2018-09-21 DIAGNOSIS — C61 Malignant neoplasm of prostate: Secondary | ICD-10-CM | POA: Diagnosis not present

## 2018-09-21 DIAGNOSIS — Z510 Encounter for antineoplastic radiation therapy: Secondary | ICD-10-CM | POA: Diagnosis not present

## 2018-09-22 ENCOUNTER — Other Ambulatory Visit: Payer: Self-pay

## 2018-09-22 ENCOUNTER — Ambulatory Visit
Admission: RE | Admit: 2018-09-22 | Discharge: 2018-09-22 | Disposition: A | Discharge status: Home / Self Care | Payer: 59 | Source: Ambulatory Visit | Attending: Radiation Oncology | Admitting: Radiation Oncology

## 2018-09-22 DIAGNOSIS — C61 Malignant neoplasm of prostate: Secondary | ICD-10-CM | POA: Diagnosis not present

## 2018-09-22 DIAGNOSIS — Z510 Encounter for antineoplastic radiation therapy: Secondary | ICD-10-CM | POA: Diagnosis not present

## 2018-09-23 ENCOUNTER — Ambulatory Visit
Admission: RE | Admit: 2018-09-23 | Discharge: 2018-09-23 | Disposition: A | Discharge status: Home / Self Care | Payer: 59 | Source: Ambulatory Visit | Attending: Radiation Oncology | Admitting: Radiation Oncology

## 2018-09-23 ENCOUNTER — Other Ambulatory Visit: Payer: Self-pay

## 2018-09-23 DIAGNOSIS — Z510 Encounter for antineoplastic radiation therapy: Secondary | ICD-10-CM | POA: Diagnosis not present

## 2018-09-23 DIAGNOSIS — C61 Malignant neoplasm of prostate: Secondary | ICD-10-CM | POA: Diagnosis not present

## 2018-09-26 ENCOUNTER — Other Ambulatory Visit: Payer: Self-pay

## 2018-09-26 ENCOUNTER — Ambulatory Visit
Admission: RE | Admit: 2018-09-26 | Discharge: 2018-09-26 | Disposition: A | Discharge status: Home / Self Care | Payer: 59 | Source: Ambulatory Visit | Attending: Radiation Oncology | Admitting: Radiation Oncology

## 2018-09-26 DIAGNOSIS — C61 Malignant neoplasm of prostate: Secondary | ICD-10-CM | POA: Diagnosis not present

## 2018-09-26 DIAGNOSIS — Z510 Encounter for antineoplastic radiation therapy: Secondary | ICD-10-CM | POA: Diagnosis not present

## 2018-09-27 ENCOUNTER — Ambulatory Visit
Admission: RE | Admit: 2018-09-27 | Discharge: 2018-09-27 | Disposition: A | Discharge status: Home / Self Care | Payer: 59 | Source: Ambulatory Visit | Attending: Radiation Oncology | Admitting: Radiation Oncology

## 2018-09-27 ENCOUNTER — Other Ambulatory Visit: Payer: Self-pay

## 2018-09-27 DIAGNOSIS — Z510 Encounter for antineoplastic radiation therapy: Secondary | ICD-10-CM | POA: Diagnosis not present

## 2018-09-27 DIAGNOSIS — C61 Malignant neoplasm of prostate: Secondary | ICD-10-CM | POA: Diagnosis not present

## 2018-09-28 ENCOUNTER — Other Ambulatory Visit: Payer: Self-pay

## 2018-09-28 ENCOUNTER — Ambulatory Visit
Admission: RE | Admit: 2018-09-28 | Discharge: 2018-09-28 | Disposition: A | Discharge status: Home / Self Care | Payer: 59 | Source: Ambulatory Visit | Attending: Radiation Oncology | Admitting: Radiation Oncology

## 2018-09-28 DIAGNOSIS — Z510 Encounter for antineoplastic radiation therapy: Secondary | ICD-10-CM | POA: Diagnosis not present

## 2018-09-28 DIAGNOSIS — C61 Malignant neoplasm of prostate: Secondary | ICD-10-CM | POA: Diagnosis not present

## 2018-09-29 ENCOUNTER — Other Ambulatory Visit: Payer: Self-pay

## 2018-09-29 ENCOUNTER — Ambulatory Visit
Admission: RE | Admit: 2018-09-29 | Discharge: 2018-09-29 | Disposition: A | Discharge status: Home / Self Care | Payer: 59 | Source: Ambulatory Visit | Attending: Radiation Oncology | Admitting: Radiation Oncology

## 2018-09-29 DIAGNOSIS — Z510 Encounter for antineoplastic radiation therapy: Secondary | ICD-10-CM | POA: Diagnosis not present

## 2018-09-29 DIAGNOSIS — C61 Malignant neoplasm of prostate: Secondary | ICD-10-CM | POA: Diagnosis not present

## 2018-09-30 ENCOUNTER — Ambulatory Visit
Admission: RE | Admit: 2018-09-30 | Discharge: 2018-09-30 | Disposition: A | Discharge status: Home / Self Care | Payer: 59 | Source: Ambulatory Visit | Attending: Radiation Oncology | Admitting: Radiation Oncology

## 2018-09-30 ENCOUNTER — Other Ambulatory Visit: Payer: Self-pay

## 2018-09-30 DIAGNOSIS — C61 Malignant neoplasm of prostate: Secondary | ICD-10-CM | POA: Diagnosis not present

## 2018-09-30 DIAGNOSIS — Z510 Encounter for antineoplastic radiation therapy: Secondary | ICD-10-CM | POA: Diagnosis not present

## 2018-10-03 ENCOUNTER — Ambulatory Visit
Admission: RE | Admit: 2018-10-03 | Discharge: 2018-10-03 | Disposition: A | Discharge status: Home / Self Care | Payer: 59 | Source: Ambulatory Visit | Attending: Radiation Oncology | Admitting: Radiation Oncology

## 2018-10-03 ENCOUNTER — Other Ambulatory Visit: Payer: Self-pay

## 2018-10-03 DIAGNOSIS — Z510 Encounter for antineoplastic radiation therapy: Secondary | ICD-10-CM | POA: Insufficient documentation

## 2018-10-03 DIAGNOSIS — C61 Malignant neoplasm of prostate: Secondary | ICD-10-CM | POA: Diagnosis not present

## 2018-10-04 ENCOUNTER — Ambulatory Visit
Admission: RE | Admit: 2018-10-04 | Discharge: 2018-10-04 | Disposition: A | Discharge status: Home / Self Care | Payer: 59 | Source: Ambulatory Visit | Attending: Radiation Oncology | Admitting: Radiation Oncology

## 2018-10-04 ENCOUNTER — Other Ambulatory Visit: Payer: Self-pay

## 2018-10-04 DIAGNOSIS — C61 Malignant neoplasm of prostate: Secondary | ICD-10-CM | POA: Diagnosis not present

## 2018-10-04 DIAGNOSIS — Z510 Encounter for antineoplastic radiation therapy: Secondary | ICD-10-CM | POA: Diagnosis not present

## 2018-10-05 ENCOUNTER — Ambulatory Visit
Admission: RE | Admit: 2018-10-05 | Discharge: 2018-10-05 | Disposition: A | Discharge status: Home / Self Care | Payer: 59 | Source: Ambulatory Visit | Attending: Radiation Oncology | Admitting: Radiation Oncology

## 2018-10-05 ENCOUNTER — Other Ambulatory Visit: Payer: Self-pay

## 2018-10-05 DIAGNOSIS — C61 Malignant neoplasm of prostate: Secondary | ICD-10-CM | POA: Diagnosis not present

## 2018-10-05 DIAGNOSIS — Z510 Encounter for antineoplastic radiation therapy: Secondary | ICD-10-CM | POA: Diagnosis not present

## 2018-10-06 ENCOUNTER — Other Ambulatory Visit: Payer: Self-pay

## 2018-10-06 ENCOUNTER — Ambulatory Visit
Admission: RE | Admit: 2018-10-06 | Discharge: 2018-10-06 | Disposition: A | Discharge status: Home / Self Care | Payer: 59 | Source: Ambulatory Visit | Attending: Radiation Oncology | Admitting: Radiation Oncology

## 2018-10-06 DIAGNOSIS — C61 Malignant neoplasm of prostate: Secondary | ICD-10-CM | POA: Diagnosis not present

## 2018-10-06 DIAGNOSIS — Z510 Encounter for antineoplastic radiation therapy: Secondary | ICD-10-CM | POA: Diagnosis not present

## 2018-10-07 ENCOUNTER — Encounter: Payer: Self-pay | Admitting: Radiation Oncology

## 2018-10-07 ENCOUNTER — Other Ambulatory Visit: Payer: Self-pay

## 2018-10-07 ENCOUNTER — Ambulatory Visit
Admission: RE | Admit: 2018-10-07 | Discharge: 2018-10-07 | Disposition: A | Discharge status: Home / Self Care | Payer: 59 | Source: Ambulatory Visit | Attending: Radiation Oncology | Admitting: Radiation Oncology

## 2018-10-07 DIAGNOSIS — C61 Malignant neoplasm of prostate: Secondary | ICD-10-CM | POA: Diagnosis not present

## 2018-10-07 DIAGNOSIS — Z510 Encounter for antineoplastic radiation therapy: Secondary | ICD-10-CM | POA: Diagnosis not present

## 2018-10-11 MED FILL — LISINOPRIL 5 MG TABLET: 5 | 30 days supply | Qty: 30 | Fill #1

## 2018-10-14 DIAGNOSIS — C61 Malignant neoplasm of prostate: Secondary | ICD-10-CM | POA: Diagnosis not present

## 2018-10-14 DIAGNOSIS — N5231 Erectile dysfunction following radical prostatectomy: Secondary | ICD-10-CM | POA: Diagnosis not present

## 2018-10-14 DIAGNOSIS — N393 Stress incontinence (female) (male): Secondary | ICD-10-CM | POA: Diagnosis not present

## 2018-10-23 NOTE — Progress Notes (Signed)
  Radiation Oncology         [phone] ________________________________  Name: Perez Mickels MRN: [id number]  Date: 10/07/2018  DOB: 1950-04-27  End of Treatment Note  Diagnosis:   68 y.o. 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 The prostate fossa and pelvic lymph nodes were initially treated to 45 Gy in 25 fractions of 1.8 Gy  2. The prostate fossa only was boosted to 68.4 Gy with 13 additional fractions of 1.8 Gy   Beams/energy:  1. The prostate fossa  and pelvic lymph nodes were initially treated using VMAT intensity modulated radiotherapy delivering 6 megavolt photons. Image guidance was performed with CB-CT studies prior to each fraction. He was immobilized with a body fix lower extremity mold.  2. The prostate fossa only was boosted using VMAT intensity modulated radiotherapy delivering 6 megavolt photons. Image guidance was performed with CB-CT studies prior to each fraction. He was immobilized with a body fix lower extremity mold.  Narrative: The patient tolerated radiation treatment relatively well.   The patient experienced some minor urinary irritation and modest fatigue.    Plan: The patient has completed radiation treatment. He will return to radiation oncology clinic for routine followup in one month. I advised him to call or return sooner if he has any questions or concerns related to his recovery or treatment. ________________________________  Sheral Apley. Tammi Klippel, M.D.

## 2018-10-26 MED FILL — ATORVASTATIN 20 MG TABLET: 20 | 90 days supply | Qty: 90 | Fill #1

## 2018-11-09 ENCOUNTER — Other Ambulatory Visit: Payer: Self-pay

## 2018-11-09 ENCOUNTER — Ambulatory Visit
Admission: RE | Admit: 2018-11-09 | Discharge: 2018-11-09 | Disposition: A | Discharge status: Home / Self Care | Payer: 59 | Source: Ambulatory Visit | Attending: Radiation Oncology | Admitting: Radiation Oncology

## 2018-11-09 DIAGNOSIS — C61 Malignant neoplasm of prostate: Secondary | ICD-10-CM | POA: Insufficient documentation

## 2018-11-09 DIAGNOSIS — Z510 Encounter for antineoplastic radiation therapy: Secondary | ICD-10-CM | POA: Insufficient documentation

## 2018-11-09 NOTE — Progress Notes (Signed)
Radiation Oncology         [phone] ________________________________  Name: Brendan Jackson MRN: [id number]  Date: 11/09/2018  DOB: 01/01/1951  Post Treatment Note  CC: Algis Greenhouse, MD  Alexis Frock, MD  Diagnosis:   68 y.o. 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     Interval Since Last Radiation:  4 weeks, concurrent with ADT 08/16/18-10/07/18: 1. The prostate fossa and pelvic lymph nodes were initially treated to 45 Gy in 25 fractions of 1.8 Gy  2. The prostate fossa only was boosted to 68.4 Gy with 13 additional fractions of 1.8 Gy    Narrative:  I spoke with the patient to conduct his routine scheduled 1 month follow up visit via telephone to spare the patient unnecessary potential exposure in the healthcare setting during the current COVID-19 pandemic.  The patient was notified in advance and gave permission to proceed with this visit format. He tolerated radiation treatment relatively well with only minor urinary irritation and modest fatigue.                                On review of systems, the patient states that he is doing well overall.  He reports gradual improvement in his LUTS and currently close to baseline with nocturia x2 per night.  He reports mild occasional dysuria at the very end of his stream and continues with a weak flow of stream but feels that he empties his bladder fairly well on voiding.  He denies gross hematuria.  He reports a healthy appetite and is maintaining his weight.  He continues to tolerate ADT fairly well with only mild fatigue.  He denies abdominal pain, nausea, vomiting, diarrhea or constipation.  Overall, he is quite pleased with his progress to date.  ALLERGIES:  has No Known Allergies.  Meds: Current Outpatient Medications  Medication Sig Dispense Refill  . acetaminophen (TYLENOL) 325 MG tablet Take 2 tablets (650 mg total) by mouth every 6 (six)  hours as needed for mild pain.    Marland Kitchen aspirin EC 81 MG tablet Take 81 mg by mouth daily.    Marland Kitchen atorvastatin (LIPITOR) 20 MG tablet Take 1 tablet (20 mg total) by mouth daily. (Patient not taking: Reported on 07/12/2018) 90 tablet 3  . carvedilol (COREG) 3.125 MG tablet TAKE 1 TABLET BY MOUTH 2 TIMES DAILY WITH A MEAL. 180 tablet 3  . Coenzyme Q10 (EQL COQ10) 300 MG CAPS Take 300 mg daily (Patient not taking: Reported on 07/12/2018) 90 capsule 3  . lisinopril (ZESTRIL) 5 MG tablet TAKE 1 TABLET BY MOUTH DAILY. 30 tablet 11   No current facility-administered medications for this encounter.     Physical Findings:  vitals were not taken for this visit.   /10 Unable to assess due to telephone follow-up visit format.  Lab Findings: Lab Results  Component Value Date   WBC 6.2 12/10/2017   HGB 13.5 12/16/2017   HCT 42.5 12/16/2017   MCV 95.7 12/10/2017   PLT 210 12/10/2017     Radiographic Findings: No results found.  Impression/Plan: 1. 68 y.o. 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 He will continue to follow up with urology for ongoing PSA determinations and was recently evaluated with a repeat PSA with Dr. Tresa Moore approximately 2 weeks  ago which was reportedly undetectable.  He has a follow-up appointment scheduled with Dr. Tresa Moore in February 2021. He understands what to expect with regards to PSA monitoring going forward. I will look forward to following his response to treatment via correspondence with urology, and would be happy to continue to participate in his care if clinically indicated. I talked to the patient about what to expect in the future, including his risk for erectile dysfunction and rectal bleeding. I encouraged him to call or return to the office if he has any questions regarding his previous radiation or possible radiation side effects. He was comfortable with this plan and will follow up as  needed.      Nicholos Johns, PA-C

## 2018-11-14 MED FILL — LISINOPRIL 5 MG TABLET: 5 | 30 days supply | Qty: 30 | Fill #2

## 2018-12-22 MED FILL — LISINOPRIL 5 MG TABS: 5 | 30 days supply | Qty: 30 | Fill #3

## 2019-01-16 MED FILL — CARVEDILOL 3.125 MG TABLET: 3.125 | 90 days supply | Qty: 180 | Fill #1

## 2019-01-16 MED FILL — LISINOPRIL 5 MG TABS: 5 | 30 days supply | Qty: 30 | Fill #4

## 2019-01-24 ENCOUNTER — Telehealth: Payer: Self-pay | Admitting: Radiation Oncology

## 2019-01-24 NOTE — Telephone Encounter (Signed)
Fax recent radiation oncology records to [place] at [facility] as requested. Fax confirmation of delivery obtained.

## 2019-01-30 DIAGNOSIS — Z0000 Encounter for general adult medical examination without abnormal findings: Secondary | ICD-10-CM | POA: Diagnosis not present

## 2019-01-30 DIAGNOSIS — E782 Mixed hyperlipidemia: Secondary | ICD-10-CM | POA: Diagnosis not present

## 2019-01-30 DIAGNOSIS — I10 Essential (primary) hypertension: Secondary | ICD-10-CM | POA: Diagnosis not present

## 2019-01-30 DIAGNOSIS — Z1211 Encounter for screening for malignant neoplasm of colon: Secondary | ICD-10-CM | POA: Diagnosis not present

## 2019-01-30 DIAGNOSIS — Z13220 Encounter for screening for lipoid disorders: Secondary | ICD-10-CM | POA: Diagnosis not present

## 2019-01-30 DIAGNOSIS — Z23 Encounter for immunization: Secondary | ICD-10-CM | POA: Diagnosis not present

## 2019-01-30 DIAGNOSIS — Z131 Encounter for screening for diabetes mellitus: Secondary | ICD-10-CM | POA: Diagnosis not present

## 2019-01-30 MED FILL — ATORVASTATIN 20 MG TABLET: 20 | 90 days supply | Qty: 90 | Fill #2

## 2019-02-01 MED FILL — EZETIMIBE 10 MG TABS: 10 | 90 days supply | Qty: 90 | Fill #0

## 2019-02-14 IMAGING — DX DG CHEST 1V PORT
1 series · 1 of 1 positions shown · non-contrast
Comparison: 09/09/2017

CLINICAL DATA: Chest tube status post cardiac surgery

EXAM:
PORTABLE CHEST 1 VIEW

[chest ap]
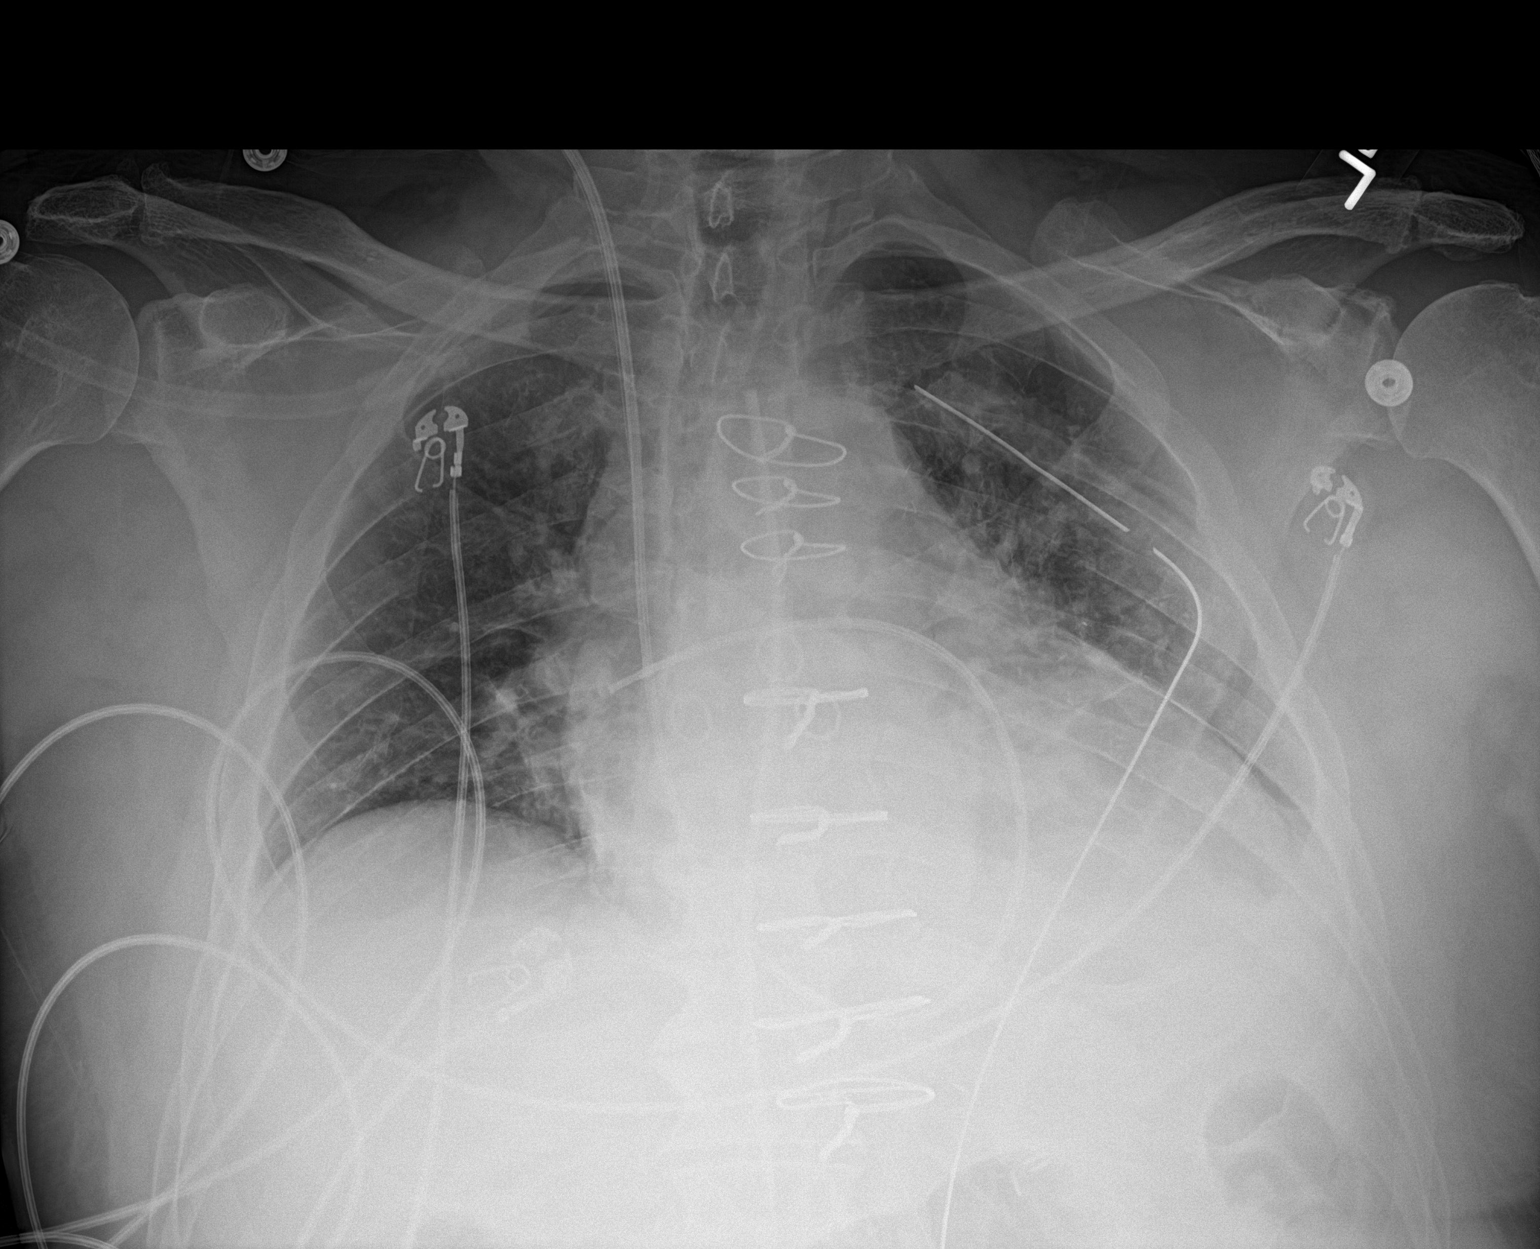

[1 of 1 positions shown; findings below may reference images not displayed]

FINDINGS: Low lung volumes with vascular crowding. Possible mild left
perihilar edema. Small left pleural effusion.

Left chest tube and mediastinal drain.  No pneumothorax.

Cardiomegaly. Postsurgical changes related to prior CABG. Right IJ
Swan-Ganz catheter terminating in the right main pulmonary artery.

Median sternotomy.
IMPRESSION: Postsurgical changes related to prior CABG.

Right IJ Swan-Ganz catheter terminates in the right main pulmonary
artery.

Left chest tube and mediastinal drain.  No pneumothorax.

Suspected small left pleural effusion. Possible mild left perihilar
edema versus vascular crowding.

## 2019-02-15 IMAGING — DX DG CHEST 1V PORT
1 series · 1 of 1 positions shown · non-contrast
Comparison: 09/10/2017

CLINICAL DATA: CABG

EXAM:
PORTABLE CHEST 1 VIEW

[chest]
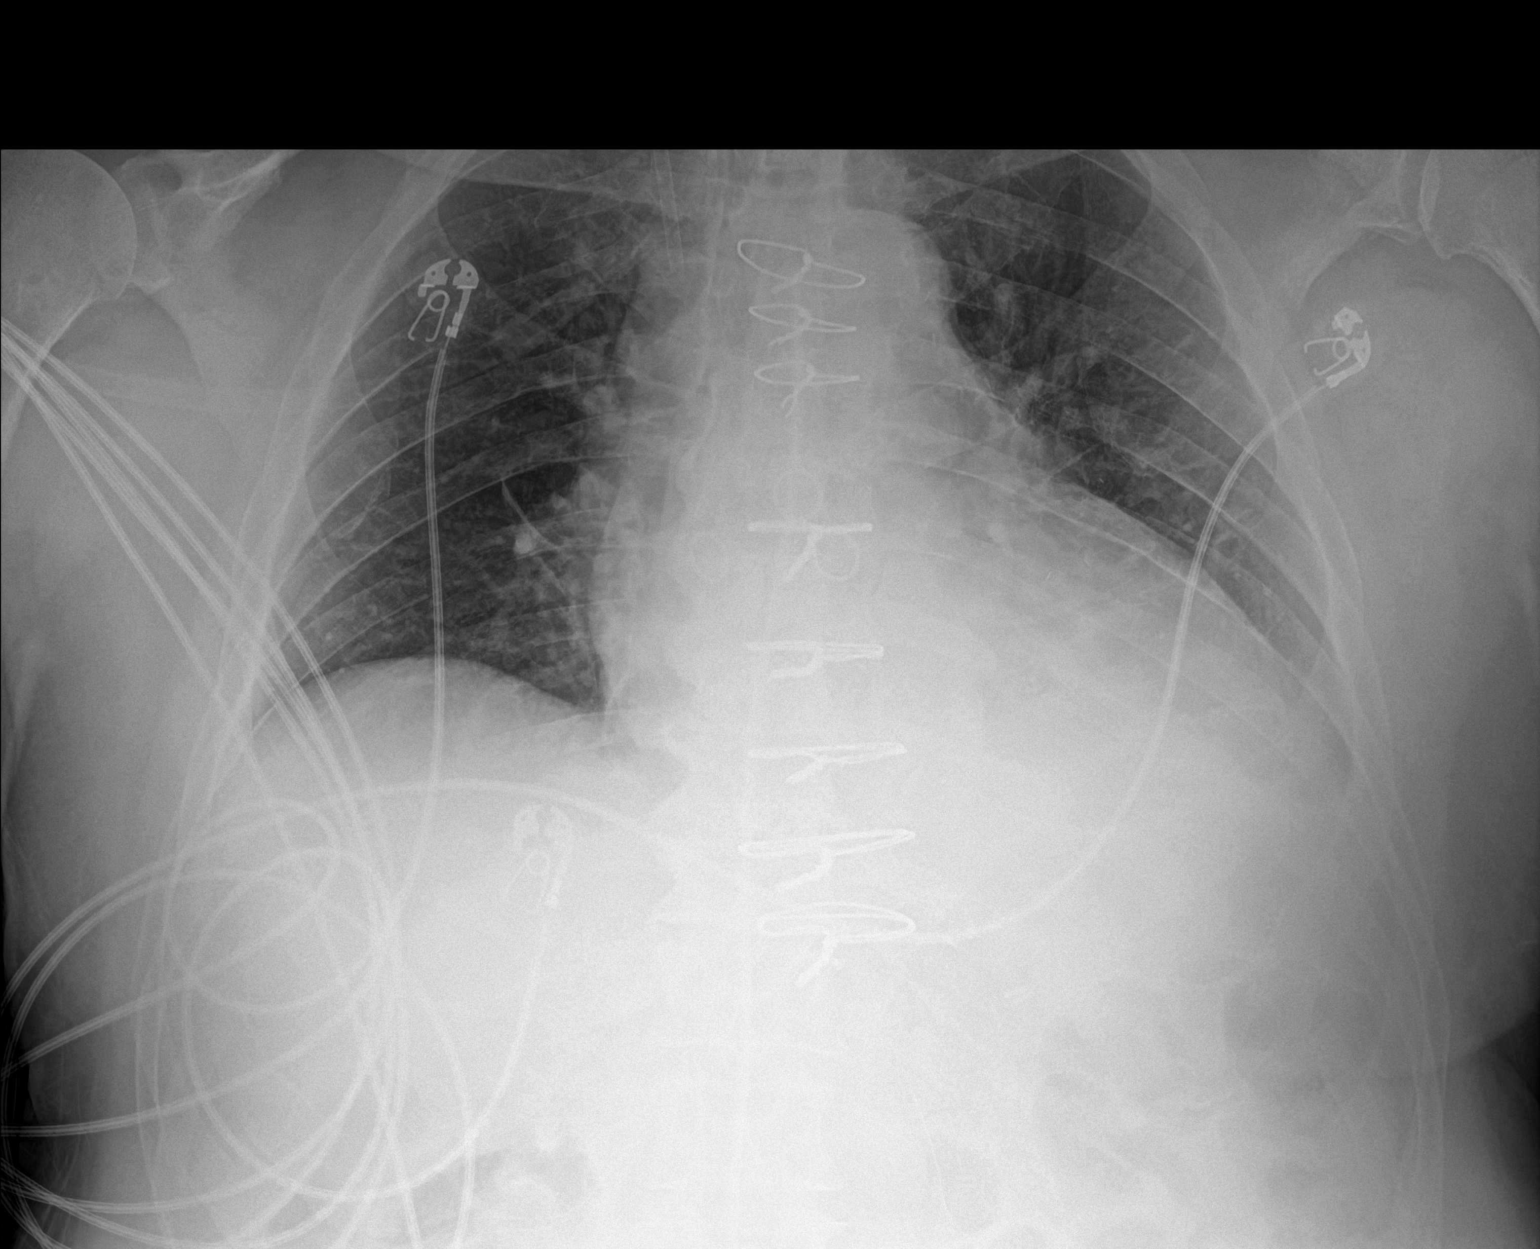

[1 of 1 positions shown; findings below may reference images not displayed]

FINDINGS: Left chest tube removed. Mediastinal drain remains in place.
Swan-Ganz catheter removed. No pneumothorax

Improved aeration in the lung bases with persistent left lower lobe
atelectasis and small left effusion. Negative for edema.
IMPRESSION: No pneumothorax post left chest tube removal

Improved aeration in the lung bases with persistent left lower lobe
atelectasis.

## 2019-02-28 MED FILL — LISINOPRIL 5 MG TABS: 5 | 30 days supply | Qty: 30 | Fill #5

## 2019-03-28 IMAGING — US IR THORACENTESIS ASP PLEURAL SPACE W/IMG GUIDE
1 series · 2 of 2 positions shown · non-contrast
Comparison: none

INDICATION: Patient is status post CABG, now with pleural effusion. Request is
made for left thoracentesis.

[Series 1: ir (id) (id)/(id)/(id) ir · 2 of 2 slices shown]
[im 1/2]
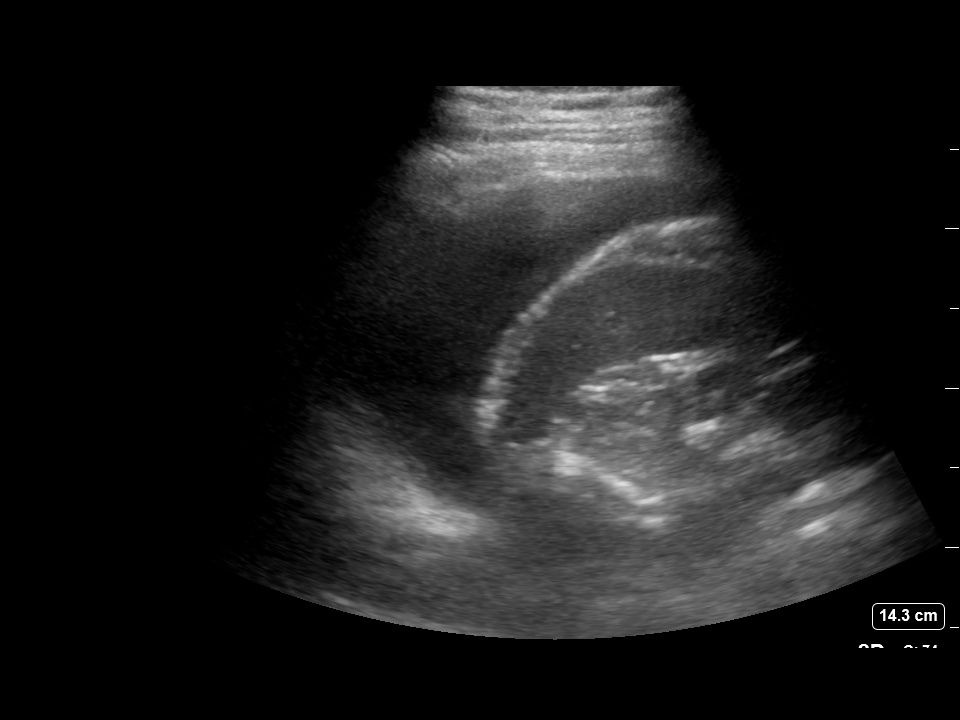
[im 2/2]
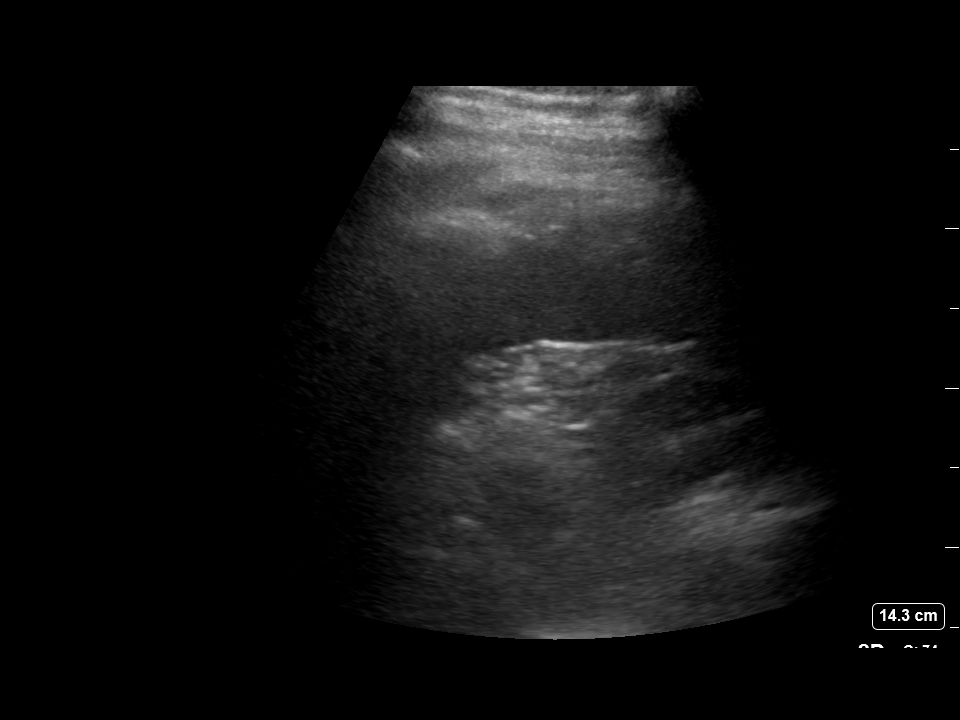

[2 of 2 positions shown; findings below may reference images not displayed]

EXAM:
ULTRASOUND GUIDED LEFT THORACENTESIS

MEDICATIONS:
10 mL 2% lidocaine

COMPLICATIONS:
None immediate.

PROCEDURE:
An ultrasound guided thoracentesis was thoroughly discussed with the
patient and questions answered. The benefits, risks, alternatives
and complications were also discussed. The patient understands and
wishes to proceed with the procedure. Written consent was obtained.

Ultrasound was performed to localize and mark an adequate pocket of
fluid in the left chest. The area was then prepped and draped in the
normal sterile fashion. 2% lidocaine was used for local anesthesia.
Under ultrasound guidance a 6 Fr Safe-T-Centesis catheter was
introduced. Thoracentesis was performed. The catheter was removed
and a dressing applied.
FINDINGS: A total of approximately 750 mL of amber fluid was removed.
IMPRESSION: Successful ultrasound guided therapeutic left thoracentesis yielding
750 mL of pleural fluid.

## 2019-04-03 MED FILL — LISINOPRIL 5 MG TABS: 5 | 30 days supply | Qty: 30 | Fill #6

## 2019-04-06 DIAGNOSIS — C61 Malignant neoplasm of prostate: Secondary | ICD-10-CM | POA: Diagnosis not present

## 2019-04-11 DIAGNOSIS — N5231 Erectile dysfunction following radical prostatectomy: Secondary | ICD-10-CM | POA: Diagnosis not present

## 2019-04-11 DIAGNOSIS — C61 Malignant neoplasm of prostate: Secondary | ICD-10-CM | POA: Diagnosis not present

## 2019-05-05 DIAGNOSIS — E782 Mixed hyperlipidemia: Secondary | ICD-10-CM | POA: Diagnosis not present

## 2019-05-11 DIAGNOSIS — Z23 Encounter for immunization: Secondary | ICD-10-CM | POA: Diagnosis not present

## 2019-05-12 MED FILL — LISINOPRIL 5 MG TABLET: 5 | 30 days supply | Qty: 30 | Fill #7

## 2019-05-12 MED FILL — ATORVASTATIN 20 MG TABLET: 20 | 90 days supply | Qty: 90 | Fill #3

## 2019-05-22 MED FILL — CARVEDILOL 3.125 MG TABLET: 3.125 | 90 days supply | Qty: 180 | Fill #2

## 2019-05-22 MED FILL — EZETIMIBE 10 MG TABS: 10 | 90 days supply | Qty: 90 | Fill #1

## 2019-06-08 DIAGNOSIS — Z23 Encounter for immunization: Secondary | ICD-10-CM | POA: Diagnosis not present

## 2019-06-11 IMAGING — CR DG CHEST 2V
2 series · 2 of 2 positions shown · non-contrast
Comparison: 10/22/2017, 10/18/2017

CLINICAL DATA: Follow-up CABG

EXAM:
CHEST - 2 VIEW

[w chest pa]
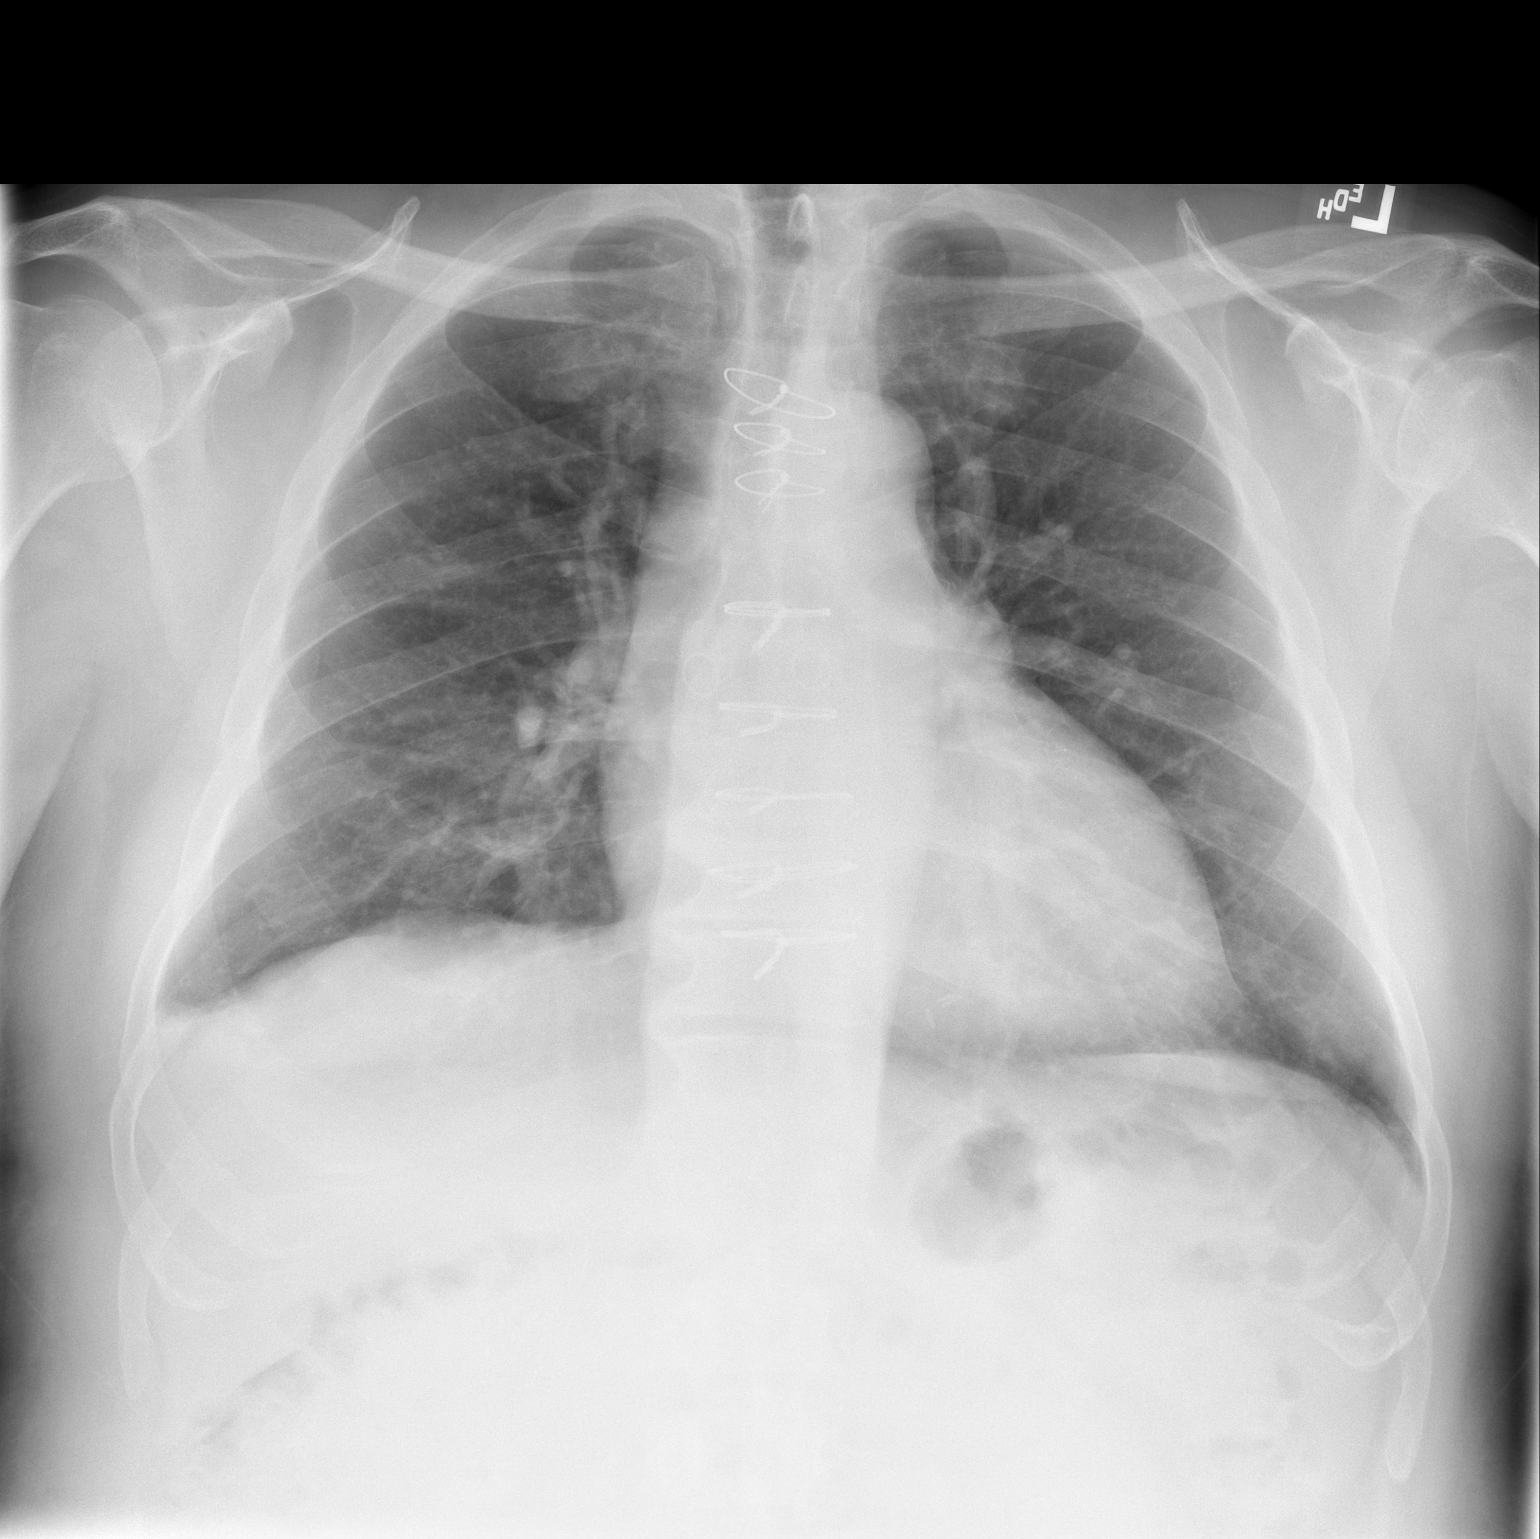

[w chest lat]
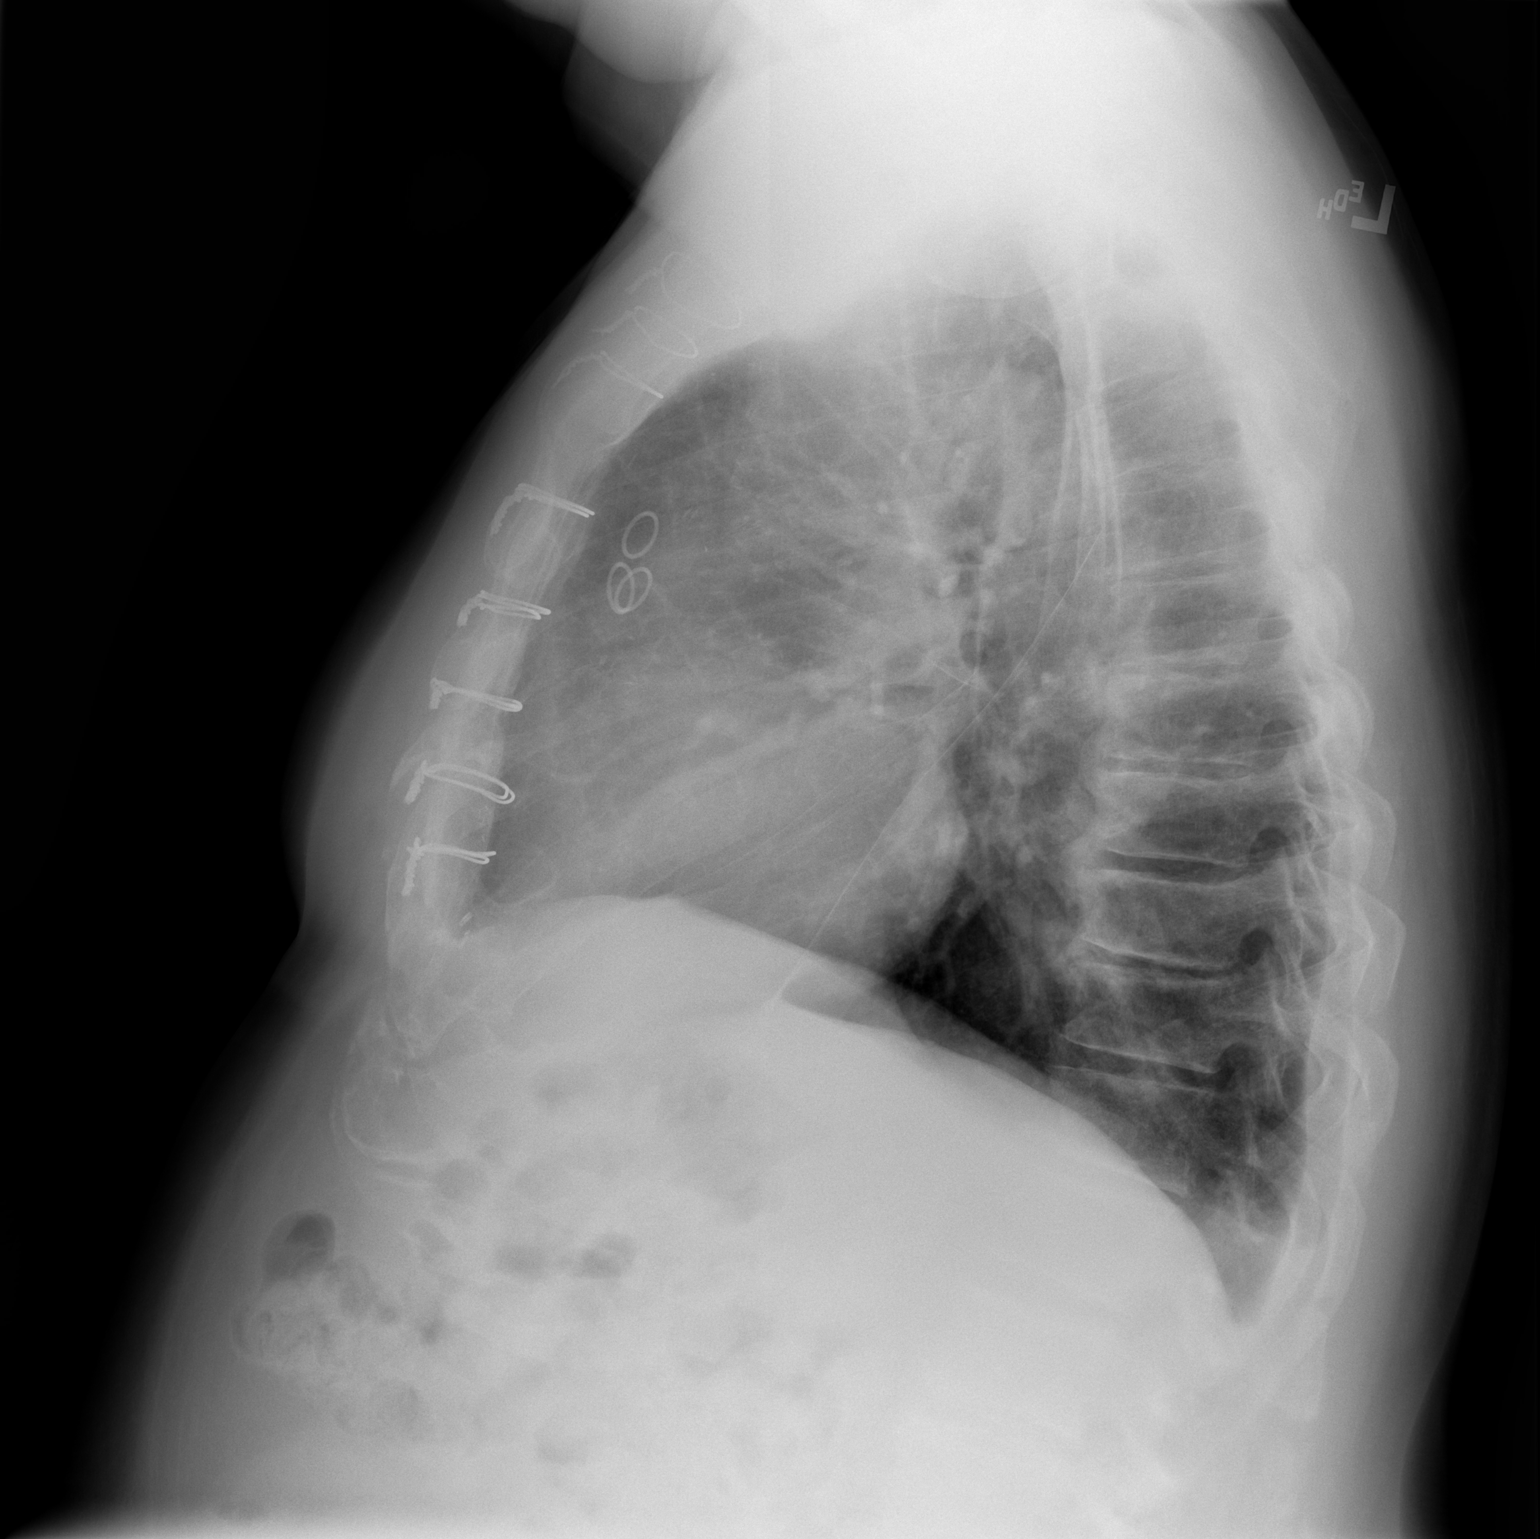

[2 of 2 positions shown; findings below may reference images not displayed]

FINDINGS: Post sternotomy changes. Resolution of previously noted left pleural
effusion. Minimal right CP angle pleural thickening.
Cardiomediastinal silhouette within normal limits. No pneumothorax.
IMPRESSION: No active cardiopulmonary disease. Post sternotomy changes.
Resolution of previously noted left pleural effusion

## 2019-06-12 NOTE — Telephone Encounter (Signed)
completed

## 2019-06-19 MED FILL — LISINOPRIL 5 MG TABLET: 5 | 30 days supply | Qty: 30 | Fill #8

## 2019-07-17 MED FILL — LISINOPRIL 5 MG TABLET: 5 | 30 days supply | Qty: 30 | Fill #9

## 2019-08-01 DIAGNOSIS — E782 Mixed hyperlipidemia: Secondary | ICD-10-CM | POA: Diagnosis not present

## 2019-08-16 ENCOUNTER — Encounter: Payer: Self-pay | Admitting: Cardiovascular Disease

## 2019-08-16 ENCOUNTER — Ambulatory Visit (INDEPENDENT_AMBULATORY_CARE_PROVIDER_SITE_OTHER): Payer: 59 | Admitting: Cardiovascular Disease

## 2019-08-16 ENCOUNTER — Other Ambulatory Visit: Payer: Self-pay

## 2019-08-16 VITALS — BP 140/94 | HR 59 | Ht 68.0 in | Wt 221.0 lb

## 2019-08-16 DIAGNOSIS — I10 Essential (primary) hypertension: Secondary | ICD-10-CM

## 2019-08-16 DIAGNOSIS — E669 Obesity, unspecified: Secondary | ICD-10-CM

## 2019-08-16 DIAGNOSIS — C61 Malignant neoplasm of prostate: Secondary | ICD-10-CM

## 2019-08-16 DIAGNOSIS — I2510 Atherosclerotic heart disease of native coronary artery without angina pectoris: Secondary | ICD-10-CM

## 2019-08-16 DIAGNOSIS — E7800 Pure hypercholesterolemia, unspecified: Secondary | ICD-10-CM | POA: Diagnosis not present

## 2019-08-16 NOTE — Patient Instructions (Signed)

## 2019-08-16 NOTE — Progress Notes (Signed)
Cardiology office note    Date:  08/16/2019   ID:  [name], [name] 12-11-1950, MRN [id number]  Patient Location: Home Provider Location: Home  PCP:  Brendan Greenhouse, MD  Cardiologist:  Brendan Jackson Electrophysiologist:  None   Evaluation Performed:  Follow-Up Visit  Chief Complaint:  CAD s/p CABG  History of Present Illness:    Brendan Jackson is a 69 y.o. male with CAD, presenting as non-ST segment elevation myocardial infarction in July 2019 leading to multivessel bypass surgery (LIMA to LAD, SVG to ramus intermedius, SVG to OM, SVG to PDA, Brendan Jackson), systemic hypertension, prostate cancer status post surgery and \ radiation therapy.  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 He denies exertional angina or dyspnea.  He does not have intermittent claudication, palpitations, syncope, orthopnea, PND or edema.  He does describe loss of sensation and paresthesias in the radial nerve distribution in both hands (dorsum of his first second and part of his third finger).  Its more common and more severe in his right hand but is beginning to occur in his left hand as well.  He does not have a history of cervical spine disease or neuropathy.  He no longer has problems with urinary leakage following his prostate radiation therapy.  PSA remains suppressed.  Labs performed just a few days ago on June 1 showed a total cholesterol 119, HDL 47, LDL 66, triglycerides 49.  Follow-up echocardiogram performed in October 2019 showed complete resolution of mild left ventricular systolic dysfunction, now EF up to 55-60%. 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  He has mild biatrial dilation.  He has frequent urination ever since his prostate procedure, but is not having a lot of problems of leakage.  His PSA was low at 0.23, per his report.  The patient does not  have symptoms concerning for COVID-19 infection (fever, chills, cough, or new shortness of breath).    Past Medical History:  Diagnosis Date  . Hyperlipidemia LDL goal <70   . Hypertension   . NSTEMI (non-ST elevated myocardial infarction) ([place]) 09/05/2017  . Prostate cancer [facility])    Past Surgical History:  Procedure Laterality Date  . CATARACT EXTRACTION  2015  . CORONARY ARTERY BYPASS GRAFT N/A 09/09/2017   Procedure: CORONARY ARTERY BYPASS GRAFTING (CABG) x 4, USING LEFT INTERNAL MAMMARY ARTERY AND RIGHT AND LEFT GREATER SAPHENOUS VEIN HARVESTED ENDOSCOPICALLY;  Surgeon: Ivin Poot, MD;  Location: [place];  Service: Open Heart Surgery;  Laterality: N/A;  . IR THORACENTESIS ASP PLEURAL SPACE W/IMG GUIDE  10/22/2017  . LEFT HEART CATH AND CORONARY ANGIOGRAPHY N/A 09/06/2017   Procedure: LEFT HEART CATH AND CORONARY ANGIOGRAPHY;  Surgeon: Lorretta Harp, MD;  Location: [place] CV LAB;  Service: Cardiovascular;  Laterality: N/A;  . LYMPHADENECTOMY Bilateral 12/15/2017   Procedure: LYMPHADENECTOMY;  Surgeon: Alexis Frock, MD;  Location: [facility];  Service: Urology;  Laterality: Bilateral;  . PROSTATE BIOPSY  05/13/2017  . ROBOT ASSISTED LAPAROSCOPIC RADICAL PROSTATECTOMY N/A 12/15/2017   Procedure: XI ROBOTIC ASSISTED LAPAROSCOPIC RADICAL PROSTATECTOMY;  Surgeon: Alexis Frock, MD;  Location: [facility];  Service: Urology;  Laterality: N/A;  . TEE WITHOUT CARDIOVERSION N/A 09/09/2017   Procedure: TRANSESOPHAGEAL ECHOCARDIOGRAM (TEE);  Surgeon: Prescott Gum, Collier Salina, MD;  Location: [place];  Service: Open Heart Surgery;  Laterality: N/A;  . TENDON REPAIR Right 1987  Thumb  . TONSILLECTOMY       Current Meds  Medication Sig  . ezetimibe (ZETIA) 10 MG tablet Take 1 tablet by mouth daily.     Allergies:   Patient has no known allergies.   Social History   Tobacco Use  . Smoking status: Never Smoker  . Smokeless tobacco: Former Systems developer    Types: Secondary school teacher  . Vaping Use: Never  used  Substance Use Topics  . Alcohol use: Yes    Alcohol/week: 1.0 standard drink    Types: 1 Cans of beer per week    Comment: occas  . Drug use: Never     Family Hx: The patient's family history includes Cancer in his father, paternal aunt, paternal aunt, paternal uncle, and paternal uncle.  ROS:   Please see the history of present illness.     All other systems reviewed and are negative.   Prior CV studies:   The following studies were reviewed today:  Echo December 07, 2017  Labs/Other Tests and Data Reviewed:    EKG: ECG ordered today shows sinus rhythm and pre-existing right bundle branch block.  No ischemic changes.  Recent Labs: No results found for requested labs within last 8760 hours.   Recent Lipid Panel Lab Results  Component Value Date/Time   CHOL 125 10/28/2017 08:35 AM   TRIG 48 10/28/2017 08:35 AM   HDL 43 10/28/2017 08:35 AM   CHOLHDL 2.9 10/28/2017 08:35 AM   CHOLHDL 4.2 09/06/2017 02:18 AM   LDLCALC 72 10/28/2017 08:35 AM   08/01/2019    total cholesterol 119, HDL 47, LDL 66, triglycerides 49.  Wt Readings from Last 3 Encounters:  08/16/19 221 lb (100.2 kg)  07/12/18 215 lb (97.5 kg)  07/01/18 220 lb (99.8 kg)     Objective:    Vital Signs:  BP (!) 140/94 (BP Location: Left Arm, Patient Position: Sitting, Cuff Size: Large)   Pulse (!) 59   Ht 5\' 8"  (1.727 m)   Wt 221 lb (100.2 kg)   BMI 33.60 kg/m     General: Alert, oriented x3, no distress, mildly obese Head: no evidence of trauma, PERRL, EOMI, no exophtalmos or lid lag, no myxedema, no xanthelasma; normal ears, nose and oropharynx Neck: normal jugular venous pulsations and no hepatojugular reflux; brisk carotid pulses without delay and no carotid bruits Chest: clear to auscultation, no signs of consolidation by percussion or palpation, normal fremitus, symmetrical and full respiratory excursions Cardiovascular: normal position and quality of the apical impulse, regular rhythm,  normal first and widely split second heart sounds, no murmurs, rubs or gallops Abdomen: no tenderness or distention, no masses by palpation, no abnormal pulsatility or arterial bruits, normal bowel sounds, no hepatosplenomegaly Extremities: no clubbing, cyanosis or edema; 2+ radial, ulnar and brachial pulses bilaterally; 2+ right femoral, posterior tibial and dorsalis pedis pulses; 2+ left femoral, posterior tibial and dorsalis pedis pulses; no subclavian or femoral bruits Neurological: grossly nonfocal Psych: Normal mood and affect   ASSESSMENT & PLAN:    1. CAD s/p CABG: Asymptomatic, without angina or dyspnea.  On statin/ezetimibe, aspirin, not on beta-blocker due to relative bradycardia. 2. HLP: Excellent lipid profile on combination atorvastatin/ezetimibe (which he tolerates better than higher doses of his statin). 3. HTN: His blood pressure is borderline high today, but at home it is consistently in the 120-30/80-84 mmHg range.  No changes made to his medications. 4. Obesity: Although he is very physically active, he has gained a little  more weight and is mildly obese.  Strongly encouraged reduction in intake of carbohydrates especially sweets and starches with high glycemic index. 5. Prostate Ca: Status post successful surgery and radiation therapy reportedly with undetectable PSA at this time.   COVID-19 Education: The signs and symptoms of COVID-19 were discussed with the patient and how to seek care for testing (follow up with PCP or arrange E-visit).  The importance of social distancing was discussed today.  Time:   Today, I have spent 16 minutes with the patient with telehealth technology discussing the above problems.     Medication Adjustments/Labs and Tests Ordered: Current medicines are reviewed at length with the patient today.  Concerns regarding medicines are outlined above.   Tests Ordered: No orders of the defined types were placed in this encounter.   Medication  Changes: No orders of the defined types were placed in this encounter.   Patient Instructions  Medication Instructions:  No changes *If you need a refill on your cardiac medications before your next appointment, please call your pharmacy*   Lab Work: None ordered If you have labs (blood work) drawn today and your tests are completely normal, you will receive your results only by: Marland Kitchen MyChart Message (if you have MyChart) OR . A paper copy in the mail If you have any lab test that is abnormal or we need to change your treatment, we will call you to review the results.   Testing/Procedures: None ordered   Follow-Up: At [facility], you and your health needs are our priority.  As part of our continuing mission to provide you with exceptional heart care, we have created designated Provider Care Teams.  These Care Teams include your primary Cardiologist (physician) and Advanced Practice Providers (APPs -  Physician Assistants and Nurse Practitioners) who all work together to provide you with the care you need, when you need it.  We recommend signing up for the patient portal called "MyChart".  Sign up information is provided on this After Visit Summary.  MyChart is used to connect with patients for Virtual Visits (Telemedicine).  Patients are able to view lab/test results, encounter notes, upcoming appointments, etc.  Non-urgent messages can be sent to your provider as well.   To learn more about what you can do with MyChart, go to [email or web address].    Your next appointment:   12 month(s)  The format for your next appointment:   In Person  Provider:   Sanda Klein, MD      Signed, Sanda Klein, MD  08/16/2019 4:06 PM    Person

## 2019-08-28 ENCOUNTER — Other Ambulatory Visit: Payer: Self-pay | Admitting: Cardiovascular Disease

## 2019-08-28 MED ORDER — ATORVASTATIN CALCIUM 20 MG PO TABS: 20.0000 mg | ORAL_TABLET | Freq: Every day | ORAL | 3 refills | Status: DC

## 2019-08-28 MED FILL — LISINOPRIL 5 MG TABLET: 5 | 30 days supply | Qty: 30 | Fill #10

## 2019-08-28 MED FILL — EZETIMIBE 10 MG TABS: 10 | 90 days supply | Qty: 90 | Fill #2

## 2019-08-28 MED FILL — ATORVASTATIN 20 MG TABLET: 20 | 90 days supply | Qty: 90 | Fill #0

## 2019-09-25 ENCOUNTER — Other Ambulatory Visit: Payer: Self-pay | Admitting: Cardiovascular Disease

## 2019-09-25 MED FILL — LISINOPRIL 5 MG TABLET: 5 | 90 days supply | Qty: 90 | Fill #0

## 2019-09-25 MED FILL — CARVEDILOL 3.125 MG TABLET: 3.125 | 90 days supply | Qty: 180 | Fill #0

## 2019-10-10 DIAGNOSIS — N5231 Erectile dysfunction following radical prostatectomy: Secondary | ICD-10-CM | POA: Diagnosis not present

## 2019-10-10 DIAGNOSIS — C61 Malignant neoplasm of prostate: Secondary | ICD-10-CM | POA: Diagnosis not present

## 2019-10-10 DIAGNOSIS — N393 Stress incontinence (female) (male): Secondary | ICD-10-CM | POA: Diagnosis not present

## 2019-11-01 ENCOUNTER — Other Ambulatory Visit (HOSPITAL_COMMUNITY): Payer: Self-pay | Admitting: Urology

## 2019-11-01 MED FILL — DULOXETINE HCL 20 MG CPEP: 20 | 30 days supply | Qty: 120 | Fill #0

## 2019-12-04 MED FILL — EZETIMIBE 10 MG TABS: 10 | 90 days supply | Qty: 90 | Fill #3

## 2019-12-04 MED FILL — ATORVASTATIN CALCIUM 20 MG: 20 | 90 days supply | Qty: 90 | Fill #1

## 2019-12-12 MED FILL — DULOXETINE HCL 20 MG CPEP: 20 | 30 days supply | Qty: 120 | Fill #1

## 2020-01-02 MED FILL — LISINOPRIL 5 MG TABLET: 5 | 90 days supply | Qty: 90 | Fill #1

## 2020-01-21 MED FILL — DULOXETINE HCL 20 MG CPEP: 20 | 30 days supply | Qty: 120 | Fill #2

## 2020-01-28 MED FILL — CARVEDILOL 3.125 MG TABLET: 3.125 | 90 days supply | Qty: 180 | Fill #1

## 2020-02-14 DIAGNOSIS — E782 Mixed hyperlipidemia: Secondary | ICD-10-CM | POA: Diagnosis not present

## 2020-02-14 DIAGNOSIS — I10 Essential (primary) hypertension: Secondary | ICD-10-CM | POA: Diagnosis not present

## 2020-02-14 DIAGNOSIS — Z131 Encounter for screening for diabetes mellitus: Secondary | ICD-10-CM | POA: Diagnosis not present

## 2020-02-14 DIAGNOSIS — Z0000 Encounter for general adult medical examination without abnormal findings: Secondary | ICD-10-CM | POA: Diagnosis not present

## 2020-02-14 DIAGNOSIS — Z23 Encounter for immunization: Secondary | ICD-10-CM | POA: Diagnosis not present

## 2020-02-21 DIAGNOSIS — Z23 Encounter for immunization: Secondary | ICD-10-CM | POA: Diagnosis not present

## 2020-03-06 ENCOUNTER — Other Ambulatory Visit (HOSPITAL_COMMUNITY): Payer: Self-pay | Admitting: Family Medicine

## 2020-03-06 MED FILL — ATORVASTATIN CALCIUM 20 MG: 20 | 90 days supply | Qty: 90 | Fill #2

## 2020-03-06 MED FILL — EZETIMIBE 10 MG TABS: 10 | 90 days supply | Qty: 90 | Fill #0

## 2020-03-13 MED FILL — DULOXETINE HCL 20 MG CPEP: 20 | 30 days supply | Qty: 120 | Fill #3

## 2020-04-15 DIAGNOSIS — N5231 Erectile dysfunction following radical prostatectomy: Secondary | ICD-10-CM | POA: Diagnosis not present

## 2020-04-15 DIAGNOSIS — C61 Malignant neoplasm of prostate: Secondary | ICD-10-CM | POA: Diagnosis not present

## 2020-04-21 MED FILL — LISINOPRIL 5 MG TABLET: 5 | 90 days supply | Qty: 90 | Fill #2

## 2020-04-22 ENCOUNTER — Other Ambulatory Visit (HOSPITAL_COMMUNITY): Payer: Self-pay | Admitting: Urology

## 2020-04-22 DIAGNOSIS — N5231 Erectile dysfunction following radical prostatectomy: Secondary | ICD-10-CM | POA: Diagnosis not present

## 2020-04-22 DIAGNOSIS — N393 Stress incontinence (female) (male): Secondary | ICD-10-CM | POA: Diagnosis not present

## 2020-04-22 DIAGNOSIS — C61 Malignant neoplasm of prostate: Secondary | ICD-10-CM | POA: Diagnosis not present

## 2020-04-22 MED FILL — DULOXETINE HCL 20 MG CPEP: 20 | 90 days supply | Qty: 360 | Fill #0

## 2020-05-23 ENCOUNTER — Other Ambulatory Visit (HOSPITAL_BASED_OUTPATIENT_CLINIC_OR_DEPARTMENT_OTHER): Payer: Self-pay

## 2020-05-23 DIAGNOSIS — H35372 Puckering of macula, left eye: Secondary | ICD-10-CM | POA: Diagnosis not present

## 2020-05-23 DIAGNOSIS — H18413 Arcus senilis, bilateral: Secondary | ICD-10-CM | POA: Diagnosis not present

## 2020-05-23 DIAGNOSIS — Z961 Presence of intraocular lens: Secondary | ICD-10-CM | POA: Diagnosis not present

## 2020-05-23 DIAGNOSIS — H26493 Other secondary cataract, bilateral: Secondary | ICD-10-CM | POA: Diagnosis not present

## 2020-05-23 DIAGNOSIS — H26492 Other secondary cataract, left eye: Secondary | ICD-10-CM | POA: Diagnosis not present

## 2020-06-11 ENCOUNTER — Other Ambulatory Visit (HOSPITAL_COMMUNITY): Payer: Self-pay

## 2020-06-19 ENCOUNTER — Other Ambulatory Visit (HOSPITAL_COMMUNITY): Payer: Self-pay

## 2020-06-19 MED FILL — Ezetimibe Tab 10 MG: ORAL | 90 days supply | Qty: 90 | Fill #0 | Status: CN

## 2020-06-20 ENCOUNTER — Other Ambulatory Visit (HOSPITAL_COMMUNITY): Payer: Self-pay

## 2020-06-28 ENCOUNTER — Other Ambulatory Visit (HOSPITAL_COMMUNITY): Payer: Self-pay

## 2020-07-01 ENCOUNTER — Other Ambulatory Visit (HOSPITAL_COMMUNITY): Payer: Self-pay

## 2020-07-01 MED FILL — Atorvastatin Calcium Tab 20 MG (Base Equivalent): ORAL | 90 days supply | Qty: 90 | Fill #0 | Status: AC

## 2020-07-01 MED FILL — Ezetimibe Tab 10 MG: ORAL | 90 days supply | Qty: 90 | Fill #0 | Status: AC

## 2020-07-01 MED FILL — Carvedilol Tab 3.125 MG: ORAL | 90 days supply | Qty: 180 | Fill #0 | Status: AC

## 2020-07-22 ENCOUNTER — Other Ambulatory Visit (HOSPITAL_COMMUNITY): Payer: Self-pay

## 2020-07-22 MED FILL — Lisinopril Tab 5 MG: ORAL | 90 days supply | Qty: 90 | Fill #0 | Status: AC

## 2020-08-28 ENCOUNTER — Other Ambulatory Visit: Payer: Self-pay

## 2020-08-28 ENCOUNTER — Ambulatory Visit (INDEPENDENT_AMBULATORY_CARE_PROVIDER_SITE_OTHER): Payer: 59 | Admitting: Cardiovascular Disease

## 2020-08-28 ENCOUNTER — Encounter: Payer: Self-pay | Admitting: Cardiovascular Disease

## 2020-08-28 VITALS — BP 132/81 | HR 64 | Ht 68.0 in | Wt 209.9 lb

## 2020-08-28 DIAGNOSIS — R059 Cough, unspecified: Secondary | ICD-10-CM

## 2020-08-28 DIAGNOSIS — I2510 Atherosclerotic heart disease of native coronary artery without angina pectoris: Secondary | ICD-10-CM | POA: Diagnosis not present

## 2020-08-28 DIAGNOSIS — R252 Cramp and spasm: Secondary | ICD-10-CM

## 2020-08-28 DIAGNOSIS — I10 Essential (primary) hypertension: Secondary | ICD-10-CM

## 2020-08-28 DIAGNOSIS — E669 Obesity, unspecified: Secondary | ICD-10-CM | POA: Diagnosis not present

## 2020-08-28 DIAGNOSIS — E7800 Pure hypercholesterolemia, unspecified: Secondary | ICD-10-CM

## 2020-08-28 DIAGNOSIS — C61 Malignant neoplasm of prostate: Secondary | ICD-10-CM | POA: Diagnosis not present

## 2020-08-28 MED ORDER — MAGNESIUM 400 MG PO CAPS: 400.0000 mg | ORAL_CAPSULE | Freq: Every day | ORAL | Status: AC

## 2020-08-28 NOTE — Patient Instructions (Addendum)
Medication Instructions:  START Magnesium 400 mg once daily. This can be bought over the counter  *If you need a refill on your cardiac medications before your next appointment, please call your pharmacy*   Lab Work: None ordered If you have labs (blood work) drawn today and your tests are completely normal, you will receive your results only by: [place] (if you have MyChart) OR A paper copy in the mail If you have any lab test that is abnormal or we need to change your treatment, we will call you to review the results.   Testing/Procedures: None ordered   Follow-Up: At [facility], you and your health needs are our priority.  As part of our continuing mission to provide you with exceptional heart care, we have created designated Provider Care Teams.  These Care Teams include your primary Cardiologist (physician) and Advanced Practice Providers (APPs -  Physician Assistants and Nurse Practitioners) who all work together to provide you with the care you need, when you need it.  We recommend signing up for the patient portal called "MyChart".  Sign up information is provided on this After Visit Summary.  MyChart is used to connect with patients for Virtual Visits (Telemedicine).  Patients are able to view lab/test results, encounter notes, upcoming appointments, etc.  Non-urgent messages can be sent to your provider as well.   To learn more about what you can do with MyChart, go to [email or web address].    Your next appointment:   12 month(s)  The format for your next appointment:   In Person  Provider:   You may see Sanda Klein, MD or one of the following Advanced Practice Providers on your designated Care Team:   Almyra Deforest, PA-C Fabian Sharp, PA-C or  Roby Lofts, [place]

## 2020-08-28 NOTE — Progress Notes (Signed)
Cardiology office note    Date:  08/28/2020   ID:  Brendan, Jackson 05/23/1950, MRN [id number]  PCP:  Algis Greenhouse, MD  Cardiologist:  Nyia Tsao Electrophysiologist:  None   Evaluation Performed:  Follow-Up Visit  Chief Complaint:  CAD s/p CABG  History of Present Illness:    Brendan Jackson is a 70 y.o. male with CAD, presenting as non-ST segment elevation myocardial infarction in July 2019 leading to multivessel bypass surgery (LIMA to LAD, SVG to ramus intermedius, SVG to OM, SVG to PDA, Dr. Darcey Nora), systemic hypertension, prostate cancer status post surgery and \ radiation therapy.  Brendan Jackson is able to do intense physical activity without any complaints of shortness of breath, angina, dizziness, palpitations or claudication.  At the end of a busy day, he will often have muscle cramps in his thighs that resolved with pickle juice.  He is also noticed a dry nonproductive cough that occurs after he lays down in bed at night and improves if he sits up.  Unfortunately, coughing also leads to urinary leakage.  He does not have edema, orthopnea or PND.  He has not experienced syncope.  Recent labs show an LDL of 64 and HDL of 49, normal triglycerides (December 2021).  He does not have diabetes and he has normal renal function.  Follow-up echocardiogram performed in October 2019 showed complete resolution of mild left ventricular systolic dysfunction, now EF up to 55-60%. 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  He has mild biatrial dilation.   Past Medical History:  Diagnosis Date   Hyperlipidemia LDL goal <70    Hypertension    NSTEMI (non-ST elevated myocardial infarction) ([place]) 09/05/2017   Prostate cancer [facility])    Past Surgical History:  Procedure Laterality Date   CATARACT EXTRACTION  2015   CORONARY ARTERY BYPASS GRAFT N/A 09/09/2017   Procedure: CORONARY ARTERY BYPASS GRAFTING (CABG) x 4, USING LEFT INTERNAL MAMMARY ARTERY AND RIGHT AND LEFT GREATER  SAPHENOUS VEIN HARVESTED ENDOSCOPICALLY;  Surgeon: Ivin Poot, MD;  Location: [place];  Service: Open Heart Surgery;  Laterality: N/A;   IR THORACENTESIS ASP PLEURAL SPACE W/IMG GUIDE  10/22/2017   LEFT HEART CATH AND CORONARY ANGIOGRAPHY N/A 09/06/2017   Procedure: LEFT HEART CATH AND CORONARY ANGIOGRAPHY;  Surgeon: Lorretta Harp, MD;  Location: [place] [facility];  Service: Cardiovascular;  Laterality: N/A;   LYMPHADENECTOMY Bilateral 12/15/2017   Procedure: LYMPHADENECTOMY;  Surgeon: Alexis Frock, MD;  Location: [facility];  Service: Urology;  Laterality: Bilateral;   PROSTATE BIOPSY  05/13/2017   ROBOT ASSISTED LAPAROSCOPIC RADICAL PROSTATECTOMY N/A 12/15/2017   Procedure: XI ROBOTIC ASSISTED LAPAROSCOPIC RADICAL PROSTATECTOMY;  Surgeon: Alexis Frock, MD;  Location: [facility];  Service: Urology;  Laterality: N/A;   TEE WITHOUT CARDIOVERSION N/A 09/09/2017   Procedure: TRANSESOPHAGEAL ECHOCARDIOGRAM (TEE);  Surgeon: Prescott Gum, Collier Salina, MD;  Location: [place];  Service: Open Heart Surgery;  Laterality: N/A;   TENDON REPAIR Right 1987   Thumb   TONSILLECTOMY       Current Meds  Medication Sig   aspirin EC 81 MG tablet Take 81 mg by mouth daily.   atorvastatin (LIPITOR) 20 MG tablet TAKE 1 TABLET BY MOUTH ONCE DAILY   carvedilol (COREG) 3.125 MG tablet TAKE 1 TABLET BY MOUTH TWO TIMES DAILY WITH A MEAL   DULoxetine (CYMBALTA) 20 MG capsule TAKE 2 CAPSULES BY MOUTH 2 TIMES DAILY   DULoxetine (CYMBALTA) 20 MG capsule TAKE 2 CAPSULES BY MOUTH  TWICE DAILY   ezetimibe (ZETIA) 10 MG tablet Take 1 tablet by mouth daily.   ezetimibe (ZETIA) 10 MG tablet TAKE 1 TABLET BY MOUTH ONCE DAILY FOR CHOLESTEROL   lisinopril (ZESTRIL) 5 MG tablet TAKE 1 TABLET BY MOUTH DAILY.   Magnesium 400 MG CAPS Take 400 mg by mouth daily.     Allergies:   Patient has no known allergies.   Social History   Tobacco Use   Smoking status: Never   Smokeless tobacco: Former    Types: Chew    Quit date: 03/02/2013   Vaping Use   Vaping Use: Never used  Substance Use Topics   Alcohol use: Yes    Alcohol/week: 1.0 standard drink    Types: 1 Cans of beer per week    Comment: occas   Drug use: Never     Family Hx: The patient's family history includes Cancer in his father, paternal aunt, paternal aunt, paternal uncle, and paternal uncle.  ROS:   Please see the history of present illness.     All other systems reviewed and are negative.   Prior CV studies:   The following studies were reviewed today:  Echo December 07, 2017  - Left ventricle: The cavity size was normal. There was mild    concentric hypertrophy. Systolic function was normal. The    estimated ejection fraction was in the range of 55% to 60%. Wall    motion was normal; there were no regional wall motion    abnormalities.  - Aortic valve: Trileaflet; mildly thickened, mildly calcified    leaflets. There was mild regurgitation.  - Aorta: Aortic root dimension: 39 mm (ED). Ascending aortic    diameter: 42 mm (S).  - Aortic root: The aortic root was mildly dilated.  - Ascending aorta: The ascending aorta was mildly dilated.  - Mitral valve: There was trivial regurgitation.  - Left atrium: The atrium was mildly dilated.  - Right ventricle: The cavity size was moderately dilated. Wall    thickness was normal. Systolic function was moderately reduced.  - Right atrium: The atrium was moderately dilated.  - Atrial septum: The septum bowed from right to left, consistent    with increased right atrial pressure.  - Pulmonic valve: There was mild regurgitation.   Labs/Other Tests and Data Reviewed:    EKG: Ordered today and personally reviewed, shows sinus rhythm, old right bundle branch block, no significant change from previous tracings.  Recent Labs: No results found for requested labs within last 8760 hours.   02/14/2020 Glucose 100, creatinine 0.91, normal liver function tests  Recent Lipid Panel Lab Results  Component  Value Date/Time   CHOL 125 10/28/2017 08:35 AM   TRIG 48 10/28/2017 08:35 AM   HDL 43 10/28/2017 08:35 AM   CHOLHDL 2.9 10/28/2017 08:35 AM   CHOLHDL 4.2 09/06/2017 02:18 AM   LDLCALC 72 10/28/2017 08:35 AM   08/01/2019  total cholesterol 119, HDL 47, LDL 66, triglycerides 49.  02/14/2020 total cholesterol 119, LDL 64, HDL 49, triglycerides 62  Wt Readings from Last 3 Encounters:  08/28/20 209 lb 14.4 oz (95.2 kg)  08/16/19 221 lb (100.2 kg)  07/12/18 215 lb (97.5 kg)     Objective:    Vital Signs:  BP 132/81   Pulse 64   Ht 5\' 8"  (1.727 m)   Wt 209 lb 14.4 oz (95.2 kg)   SpO2 95%   BMI 31.92 kg/m      General: Alert, oriented x3,  no distress, mildly obese Head: no evidence of trauma, PERRL, EOMI, no exophtalmos or lid lag, no myxedema, no xanthelasma; normal ears, nose and oropharynx Neck: normal jugular venous pulsations and no hepatojugular reflux; brisk carotid pulses without delay and no carotid bruits Chest: clear to auscultation, no signs of consolidation by percussion or palpation, normal fremitus, symmetrical and full respiratory excursions Cardiovascular: normal position and quality of the apical impulse, regular rhythm, normal first and at least split second heart sounds, short systolic ejection murmur, no diastolic murmurs, rubs or gallops Abdomen: no tenderness or distention, no masses by palpation, no abnormal pulsatility or arterial bruits, normal bowel sounds, no hepatosplenomegaly Extremities: no clubbing, cyanosis or edema; 2+ radial, ulnar and brachial pulses bilaterally; 2+ right femoral, posterior tibial and dorsalis pedis pulses; 2+ left femoral, posterior tibial and dorsalis pedis pulses; no subclavian or femoral bruits Neurological: grossly nonfocal Psych: Normal mood and affect   ASSESSMENT & PLAN:    1. Coronary artery disease involving native coronary artery of native heart without angina pectoris   2. Hypercholesterolemia   3. Essential  hypertension   4. Mild obesity   5. Malignant neoplasm of prostate ([place])   6. Muscle cramps   7. Cough      CAD s/p CABG: Asymptomatic.  Does not have exertional angina or dyspnea.  On aspirin, statin/ezetimibe, not on beta-blockers due to bradycardia. HLP: All parameters in target range on combination of statin/ezetimibe (which he tolerates better than higher doses of his statin). HTN: Well-controlled.  Often has high blood pressure when he first checks into the office. Obesity: Unfortunately he has gained a little more weight.  Encouraged him to avoid carbohydrates, especially sweets and starches with high glycemic index. Prostate Ca: Status post successful surgery and radiation therapy. Muscle cramps: Encourage better intake of water during the day.  Take magnesium oxide 400 mg once daily.  Cramps do not occur during physical activity with late at night.  He has excellent distal pulses.  These do not represent intermittent claudication. Supine cough: This is positional and unlikely to be related to ACE inhibitor.  Its been going on for the last 6 months or so and has been taking an ACE inhibitor much longer than that.  He has no other symptoms or signs to suggest congestive heart failure/orthopnea.  I wonder whether he may have GERD or postnasal drip.  Try elevating the head of the bed a few degrees.  He has been reluctant to take antihistamines for allergic sinusitis since these caused urinary retention in the past.  Told him that it should be safe for him to use the newer generation antihistamines that have lower anticholinergic effect such as loratadine or fexofenadine.   Patient Instructions  Medication Instructions:  START Magnesium 400 mg once daily. This can be bought over the counter  *If you need a refill on your cardiac medications before your next appointment, please call your pharmacy*   Lab Work: None ordered If you have labs (blood work) drawn today and your tests are  completely normal, you will receive your results only by: [place] (if you have MyChart) OR A paper copy in the mail If you have any lab test that is abnormal or we need to change your treatment, we will call you to review the results.   Testing/Procedures: None ordered   Follow-Up: At [facility], you and your health needs are our priority.  As part of our continuing mission to provide you with exceptional heart care,  we have created designated Provider Care Teams.  These Care Teams include your primary Cardiologist (physician) and Advanced Practice Providers (APPs -  Physician Assistants and Nurse Practitioners) who all work together to provide you with the care you need, when you need it.  We recommend signing up for the patient portal called "MyChart".  Sign up information is provided on this After Visit Summary.  MyChart is used to connect with patients for Virtual Visits (Telemedicine).  Patients are able to view lab/test results, encounter notes, upcoming appointments, etc.  Non-urgent messages can be sent to your provider as well.   To learn more about what you can do with MyChart, go to [email or web address].    Your next appointment:   12 month(s)  The format for your next appointment:   In Person  Provider:   You may see Sanda Klein, MD or one of the following Advanced Practice Providers on your designated Care Team:   Almyra Deforest, PA-C Fabian Sharp, [place] or  Roby Lofts, PA-C    Signed, Sanda Klein, MD  08/28/2020 4:04 PM    [place]

## 2020-08-29 NOTE — Progress Notes (Signed)
I called Brendan Jackson to tell him that I may have inadvertently exposed him and [name] to COVID-19, since I tested positive for the illness the day after our encounter.  My COVID-19 test had been negative on the morning of our appointment and I was asymptomatic when we met.  We all wore masks throughout the visit.  Hopefully this will reduce the risk of transmission.  If he does develop symptoms of viral illness such as fever, cough, sore throat, dyspnea, etc. I have asked him to promptly test for COVID-19 and call me or Dr. Garlon Hatchet to discuss possible treatments.

## 2020-10-07 ENCOUNTER — Other Ambulatory Visit: Payer: Self-pay | Admitting: Cardiovascular Disease

## 2020-10-07 ENCOUNTER — Other Ambulatory Visit (HOSPITAL_COMMUNITY): Payer: Self-pay

## 2020-10-07 MED FILL — Ezetimibe Tab 10 MG: ORAL | 90 days supply | Qty: 90 | Fill #1 | Status: AC

## 2020-10-07 MED FILL — Atorvastatin Calcium Tab 20 MG (Base Equivalent): ORAL | 90 days supply | Qty: 90 | Fill #0 | Status: AC

## 2020-10-21 ENCOUNTER — Other Ambulatory Visit (HOSPITAL_COMMUNITY): Payer: Self-pay

## 2020-10-21 MED FILL — Duloxetine HCl Enteric Coated Pellets Cap 20 MG (Base Eq): ORAL | 90 days supply | Qty: 360 | Fill #0 | Status: AC

## 2020-10-22 ENCOUNTER — Other Ambulatory Visit (HOSPITAL_COMMUNITY): Payer: Self-pay

## 2020-11-05 ENCOUNTER — Other Ambulatory Visit (HOSPITAL_COMMUNITY): Payer: Self-pay

## 2020-11-05 ENCOUNTER — Other Ambulatory Visit: Payer: Self-pay | Admitting: Cardiovascular Disease

## 2020-11-05 MED ORDER — LISINOPRIL 5 MG PO TABS
ORAL_TABLET | Freq: Every day | ORAL | 3 refills | Status: DC
  Filled 2020-11-05: qty 90, 90d supply, fill #0
  Filled 2021-02-11: qty 90, 90d supply, fill #1
  Filled 2021-05-20: qty 90, 90d supply, fill #2
  Filled 2021-08-25: qty 90, 90d supply, fill #3

## 2020-12-20 ENCOUNTER — Other Ambulatory Visit (HOSPITAL_COMMUNITY): Payer: Self-pay

## 2020-12-20 ENCOUNTER — Other Ambulatory Visit: Payer: Self-pay | Admitting: Cardiovascular Disease

## 2020-12-20 MED ORDER — CARVEDILOL 3.125 MG PO TABS
ORAL_TABLET | Freq: Two times a day (BID) | ORAL | 3 refills | Status: DC
  Filled 2020-12-20: qty 180, 90d supply, fill #0
  Filled 2021-06-04: qty 180, 90d supply, fill #1
  Filled 2021-11-24: qty 180, 90d supply, fill #2

## 2021-01-17 DIAGNOSIS — Z23 Encounter for immunization: Secondary | ICD-10-CM | POA: Diagnosis not present

## 2021-01-20 ENCOUNTER — Other Ambulatory Visit (HOSPITAL_COMMUNITY): Payer: Self-pay

## 2021-01-20 MED FILL — Ezetimibe Tab 10 MG: ORAL | 90 days supply | Qty: 90 | Fill #2 | Status: AC

## 2021-01-20 MED FILL — Atorvastatin Calcium Tab 20 MG (Base Equivalent): ORAL | 90 days supply | Qty: 90 | Fill #1 | Status: AC

## 2021-02-07 DIAGNOSIS — E782 Mixed hyperlipidemia: Secondary | ICD-10-CM | POA: Diagnosis not present

## 2021-02-07 DIAGNOSIS — I10 Essential (primary) hypertension: Secondary | ICD-10-CM | POA: Diagnosis not present

## 2021-02-11 ENCOUNTER — Other Ambulatory Visit (HOSPITAL_COMMUNITY): Payer: Self-pay

## 2021-02-17 DIAGNOSIS — Z131 Encounter for screening for diabetes mellitus: Secondary | ICD-10-CM | POA: Diagnosis not present

## 2021-02-17 DIAGNOSIS — Z0000 Encounter for general adult medical examination without abnormal findings: Secondary | ICD-10-CM | POA: Diagnosis not present

## 2021-02-17 DIAGNOSIS — Z87891 Personal history of nicotine dependence: Secondary | ICD-10-CM | POA: Diagnosis not present

## 2021-02-17 DIAGNOSIS — I10 Essential (primary) hypertension: Secondary | ICD-10-CM | POA: Diagnosis not present

## 2021-02-17 DIAGNOSIS — Z1211 Encounter for screening for malignant neoplasm of colon: Secondary | ICD-10-CM | POA: Diagnosis not present

## 2021-02-17 DIAGNOSIS — E782 Mixed hyperlipidemia: Secondary | ICD-10-CM | POA: Diagnosis not present

## 2021-04-07 ENCOUNTER — Other Ambulatory Visit (HOSPITAL_COMMUNITY): Payer: Self-pay

## 2021-04-07 MED FILL — Duloxetine HCl Enteric Coated Pellets Cap 20 MG (Base Eq): ORAL | 90 days supply | Qty: 360 | Fill #1 | Status: AC

## 2021-04-08 ENCOUNTER — Other Ambulatory Visit (HOSPITAL_COMMUNITY): Payer: Self-pay

## 2021-04-09 ENCOUNTER — Other Ambulatory Visit (HOSPITAL_COMMUNITY): Payer: Self-pay

## 2021-04-21 ENCOUNTER — Other Ambulatory Visit (HOSPITAL_COMMUNITY): Payer: Self-pay

## 2021-04-21 MED ORDER — EZETIMIBE 10 MG PO TABS
ORAL_TABLET | ORAL | 3 refills | Status: DC
  Filled 2021-04-21: qty 90, 90d supply, fill #0
  Filled 2021-07-29: qty 90, 90d supply, fill #1

## 2021-04-21 MED FILL — Atorvastatin Calcium Tab 20 MG (Base Equivalent): ORAL | 90 days supply | Qty: 90 | Fill #2 | Status: AC

## 2021-04-24 ENCOUNTER — Other Ambulatory Visit (HOSPITAL_COMMUNITY): Payer: Self-pay

## 2021-04-29 DIAGNOSIS — C61 Malignant neoplasm of prostate: Secondary | ICD-10-CM | POA: Diagnosis not present

## 2021-05-06 ENCOUNTER — Other Ambulatory Visit (HOSPITAL_COMMUNITY): Payer: Self-pay

## 2021-05-06 DIAGNOSIS — N5231 Erectile dysfunction following radical prostatectomy: Secondary | ICD-10-CM | POA: Diagnosis not present

## 2021-05-06 DIAGNOSIS — C61 Malignant neoplasm of prostate: Secondary | ICD-10-CM | POA: Diagnosis not present

## 2021-05-06 DIAGNOSIS — N393 Stress incontinence (female) (male): Secondary | ICD-10-CM | POA: Diagnosis not present

## 2021-05-06 MED ORDER — DULOXETINE HCL 40 MG PO CPEP
ORAL_CAPSULE | ORAL | 3 refills | Status: DC
  Filled 2021-05-06 – 2021-09-22 (×2): qty 180, 90d supply, fill #0

## 2021-05-20 ENCOUNTER — Other Ambulatory Visit (HOSPITAL_COMMUNITY): Payer: Self-pay

## 2021-06-04 ENCOUNTER — Other Ambulatory Visit (HOSPITAL_COMMUNITY): Payer: Self-pay

## 2021-07-25 DIAGNOSIS — N393 Stress incontinence (female) (male): Secondary | ICD-10-CM | POA: Diagnosis not present

## 2021-07-25 DIAGNOSIS — N5231 Erectile dysfunction following radical prostatectomy: Secondary | ICD-10-CM | POA: Diagnosis not present

## 2021-07-29 ENCOUNTER — Other Ambulatory Visit (HOSPITAL_COMMUNITY): Payer: Self-pay

## 2021-07-29 MED FILL — Atorvastatin Calcium Tab 20 MG (Base Equivalent): ORAL | 90 days supply | Qty: 90 | Fill #3 | Status: AC

## 2021-08-04 ENCOUNTER — Other Ambulatory Visit: Payer: Self-pay | Admitting: Urology

## 2021-08-04 ENCOUNTER — Telehealth: Payer: Self-pay | Admitting: Cardiovascular Disease

## 2021-08-04 NOTE — Telephone Encounter (Signed)
   Pre-operative Risk Assessment    Patient Name: Brendan Jackson  DOB: 1950/10/04 MRN: [id number]      Request for Surgical Clearance    Procedure:  Inflatable Penial Prosthesis  Date of Surgery:  Clearance  09-12-21                                  Surgeon:  Dr Festus Aloe Surgeon's Group or Practice Name:   Phone number:  [phone] Fax number:  [phone]   Type of Clearance Requested:   - Medical  - Pharmacy:  Hold Aspirin 5 days prior   Type of Anesthesia:  General    Additional requests/questions:    Lorin Glass   08/04/2021, 1:19 PM

## 2021-08-06 NOTE — Telephone Encounter (Signed)
I called the pt and s/w his wife who asked if I could call back tomorrow around 8:30. She states not sure if the pt will still be having  surgery as not sure if the insurance is going to cover.

## 2021-08-06 NOTE — Telephone Encounter (Signed)
OK to hold aspirin for the procedure

## 2021-08-06 NOTE — Telephone Encounter (Signed)
    Name: Brendan Jackson  DOB: 24-Jan-1951  MRN: [id number]  Primary Cardiologist: Sanda Klein, MD   Preoperative team, please contact this patient and set up a phone call appointment for further preoperative risk assessment. Please obtain consent and complete medication review. Thank you for your help.  I confirm that guidance regarding antiplatelet and oral anticoagulation therapy has been completed and, if necessary, noted below.  Per Dr. Sallyanne Kuster, primary cardiologist, patient may hold aspirin for 5 days prior to procedure. Please resume aspirin as soon as possible postprocedure, at the discretion of the surgeon.  Lenna Sciara, NP 08/06/2021, 2:26 PM [place] [address] [place], [place] [address]

## 2021-08-06 NOTE — Telephone Encounter (Signed)
   Patient Name: Brendan Jackson  DOB: [birth year] MRN: [id number]  Primary Cardiologist: Sanda Klein, MD  Chart reviewed as part of pre-operative protocol coverage.   71 year old male with a history of CAD s/p CABG (LIMA-LAD, SVG-RI, and SVG-PDA) in 2019, hypertension, hyperlipidemia, obesity, and prostate cancer s/p surgery and radiation therapy.  Most recent echocardiogram in October 2019 showed EF 55 to 60%, no RWMA, mild dilation of ascending aorta and aortic root, no significant valvular abnormalities. He was last seen in the office on 08/28/2020 and was doing well from a cardiac standpoint.  Denied symptoms concerning for angina. BP was well controlled. He did report occasional muscle cramps and was advised to take magnesium supplement. Additionally, he reported a supine cough thought to be related to allergies vs. GERD.  Received surgical clearance request for upcoming inflatable penile prosthesis scheduled for 09/12/2021 with request to hold aspirin for 5 days prior to procedure.  Dr. Sallyanne Kuster, please advise on holding aspirin prior to surgery.  Please route your response to P CV DIV PREOP.   Thank you.   Lenna Sciara, NP 08/06/2021, 11:40 AM

## 2021-08-07 NOTE — Telephone Encounter (Signed)
Patient has been scheduled for August 10th with Dr. Sallyanne Kuster. Surgery may be canceled. The patient will let us know if anything changes.

## 2021-08-07 NOTE — Telephone Encounter (Addendum)
I s/w the pt and his wife about scheduling a tele pre op appt. Was stated to me that the pt may not even have the surgery as they are needing to find out if the insurance company is going to pay for it. Pt's wife, who is also a [employer] employee tells me that the pt is due to see Dr. Sallyanne Kuster sometime in June or July 2023. I do see the recall in epic. Per the pt's wife would like to just set up an appt with Dr. Sallyanne Kuster for pt's annual appt and pre op at the same time in case they do decide to go through with the surgery.   Pt's wife also states they will be moving the surgery date later as 09/12/21 is not going to work for a surgery date for the pt. I was unable to find a available appt time for Dr. Sallyanne Kuster. I advised what I will do is send a message to Dr. Lurline Del nurse for an appt. Pt's wife asked to call her with the appt. Pt's wife thanked me for the call today and the help.   I will send a staff message to Cristopher Peru RN, nurse for Dr. Sallyanne Kuster to reach out to the pt and his wife with an appt for yrl f/u and pre op clearance as well.   I will also update the pre op provider and the surgeon office.

## 2021-08-25 ENCOUNTER — Other Ambulatory Visit (HOSPITAL_COMMUNITY): Payer: Self-pay

## 2021-09-12 ENCOUNTER — Ambulatory Visit (HOSPITAL_BASED_OUTPATIENT_CLINIC_OR_DEPARTMENT_OTHER): Admit: 2021-09-12 | Payer: Medicare Other | Admitting: Urology

## 2021-09-12 ENCOUNTER — Encounter (HOSPITAL_BASED_OUTPATIENT_CLINIC_OR_DEPARTMENT_OTHER): Payer: Self-pay

## 2021-09-12 SURGERY — INSERTION, PENILE PROSTHESIS, INFLATABLE
Anesthesia: General

## 2021-09-19 ENCOUNTER — Other Ambulatory Visit (HOSPITAL_COMMUNITY): Payer: Self-pay

## 2021-09-19 MED ORDER — GOLYTELY 236 G PO SOLR
ORAL | 0 refills | Status: DC
  Filled 2021-09-19: qty 4000, 1d supply, fill #0

## 2021-09-22 ENCOUNTER — Other Ambulatory Visit: Payer: Self-pay | Admitting: Urology

## 2021-09-22 ENCOUNTER — Other Ambulatory Visit (HOSPITAL_COMMUNITY): Payer: Self-pay

## 2021-09-29 ENCOUNTER — Other Ambulatory Visit (HOSPITAL_COMMUNITY): Payer: Self-pay

## 2021-09-29 ENCOUNTER — Other Ambulatory Visit: Payer: Self-pay | Admitting: Urology

## 2021-10-01 ENCOUNTER — Other Ambulatory Visit (HOSPITAL_COMMUNITY): Payer: Self-pay

## 2021-10-01 MED ORDER — DULOXETINE HCL 20 MG PO CPEP
ORAL_CAPSULE | ORAL | 3 refills | Status: DC
  Filled 2021-10-01: qty 60, 30d supply, fill #0

## 2021-10-09 ENCOUNTER — Ambulatory Visit (INDEPENDENT_AMBULATORY_CARE_PROVIDER_SITE_OTHER): Payer: 59 | Admitting: Cardiovascular Disease

## 2021-10-09 ENCOUNTER — Encounter: Payer: Self-pay | Admitting: Cardiovascular Disease

## 2021-10-09 VITALS — BP 126/72 | HR 61 | Ht 68.0 in | Wt 221.6 lb

## 2021-10-09 DIAGNOSIS — I2510 Atherosclerotic heart disease of native coronary artery without angina pectoris: Secondary | ICD-10-CM

## 2021-10-09 DIAGNOSIS — E669 Obesity, unspecified: Secondary | ICD-10-CM

## 2021-10-09 DIAGNOSIS — E7800 Pure hypercholesterolemia, unspecified: Secondary | ICD-10-CM | POA: Diagnosis not present

## 2021-10-09 DIAGNOSIS — I10 Essential (primary) hypertension: Secondary | ICD-10-CM | POA: Diagnosis not present

## 2021-10-09 DIAGNOSIS — C61 Malignant neoplasm of prostate: Secondary | ICD-10-CM | POA: Diagnosis not present

## 2021-10-09 NOTE — Progress Notes (Signed)
Cardiology office note    Date:  10/09/2021   ID:  Brendan Jackson Mar 21, 1950, MRN [id number]  PCP:  Algis Greenhouse, MD  Cardiologist:  Shanisha Lech Electrophysiologist:  None   Evaluation Performed:  Follow-Up Visit  Chief Complaint:  CAD s/p CABG  History of Present Illness:    Brendan Jackson is a 71 y.o. male with CAD, presenting as non-ST segment elevation myocardial infarction in July 2019 leading to multivessel bypass surgery (LIMA to LAD, SVG to ramus intermedius, SVG to OM, SVG to PDA, Dr. Darcey Nora), systemic hypertension, prostate cancer status post surgery and \ radiation therapy.  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  The patient specifically denies any chest pain at rest or with exertion, dyspnea at rest or with exertion, orthopnea, paroxysmal nocturnal dyspnea, syncope, palpitations, focal neurological deficits, intermittent claudication, lower extremity edema, unexplained weight gain, cough, hemoptysis or wheezing. He has erectile dysfunction following prostate cancer therapy, but insurance declined to cover penile prosthesis surgery.  The most recent lipid profile from his primary care provider from February 07, 2021 showed HDL 46, direct LDL 70, triglycerides 49.  Creatinine 0.95, potassium 4.7, normal liver function tests.  Follow-up echocardiogram performed in October 2019 showed complete resolution of mild left ventricular systolic dysfunction, now EF up to 55-60%. 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  He has mild biatrial dilation.   Past Medical History:  Diagnosis Date   Hyperlipidemia LDL goal <70    Hypertension    NSTEMI (non-ST elevated myocardial infarction) ([place]) 09/05/2017   Prostate cancer [facility])    Past Surgical History:  Procedure Laterality Date   CATARACT EXTRACTION  2015   CORONARY ARTERY BYPASS GRAFT N/A 09/09/2017   Procedure:  CORONARY ARTERY BYPASS GRAFTING (CABG) x 4, USING LEFT INTERNAL MAMMARY ARTERY AND RIGHT AND LEFT GREATER SAPHENOUS VEIN HARVESTED ENDOSCOPICALLY;  Surgeon: Ivin Poot, MD;  Location: [place];  Service: Open Heart Surgery;  Laterality: N/A;   IR THORACENTESIS ASP PLEURAL SPACE W/IMG GUIDE  10/22/2017   LEFT HEART CATH AND CORONARY ANGIOGRAPHY N/A 09/06/2017   Procedure: LEFT HEART CATH AND CORONARY ANGIOGRAPHY;  Surgeon: Lorretta Harp, MD;  Location: [place] CV LAB;  Service: Cardiovascular;  Laterality: N/A;   LYMPHADENECTOMY Bilateral 12/15/2017   Procedure: LYMPHADENECTOMY;  Surgeon: Alexis Frock, MD;  [facility];  Service: Urology;  Laterality: Bilateral;   PROSTATE BIOPSY  05/13/2017   ROBOT ASSISTED LAPAROSCOPIC RADICAL PROSTATECTOMY N/A 12/15/2017   Procedure: XI ROBOTIC ASSISTED LAPAROSCOPIC RADICAL PROSTATECTOMY;  Surgeon: Alexis Frock, MD;  [facility];  Service: Urology;  Laterality: N/A;   TEE WITHOUT CARDIOVERSION N/A 09/09/2017   Procedure: TRANSESOPHAGEAL ECHOCARDIOGRAM (TEE);  Surgeon: Prescott Gum, Collier Salina, MD;  Location: [place];  Service: Open Heart Surgery;  Laterality: N/A;   TENDON REPAIR Right 1987   Thumb   TONSILLECTOMY       Current Meds  Medication Sig   aspirin EC 81 MG tablet Take 81 mg by mouth daily.   atorvastatin (LIPITOR) 20 MG tablet TAKE 1 TABLET BY MOUTH ONCE DAILY   carvedilol (COREG) 3.125 MG tablet TAKE 1 TABLET BY MOUTH TWICE DAILY WITH A MEAL   DULoxetine (CYMBALTA) 20 MG capsule TAKE 2 CAPSULES BY MOUTH 2 TIMES DAILY   ezetimibe (ZETIA) 10 MG tablet Take 1 tablet by mouth daily.   ezetimibe (ZETIA) 10 MG tablet Take 1 tablet (10  mg total) by mouth daily for cholesterol.   lisinopril (ZESTRIL) 5 MG tablet TAKE 1 TABLET BY MOUTH DAILY.   Magnesium 400 MG CAPS Take 400 mg by mouth daily. (Patient taking differently: Take 500 mg by mouth daily.)     Allergies:   Patient has no known allergies.   Social History   Tobacco Use    Smoking status: Never   Smokeless tobacco: Former    Types: Chew    Quit date: 03/02/2013  Vaping Use   Vaping Use: Never used  Substance Use Topics   Alcohol use: Yes    Alcohol/week: 1.0 standard drink of alcohol    Types: 1 Cans of beer per week    Comment: occas   Drug use: Never     Family Hx: The patient's family history includes Cancer in his father, paternal aunt, paternal aunt, paternal uncle, and paternal uncle.  ROS:   Please see the history of present illness.     All other systems reviewed and are negative.   Prior CV studies:   The following studies were reviewed today:  Echo December 07, 2017  - Left ventricle: The cavity size was normal. There was mild    concentric hypertrophy. Systolic function was normal. The    estimated ejection fraction was in the range of 55% to 60%. Wall    motion was normal; there were no regional wall motion    abnormalities.  - Aortic valve: Trileaflet; mildly thickened, mildly calcified    leaflets. There was mild regurgitation.  - Aorta: Aortic root dimension: 39 mm (ED). Ascending aortic    diameter: 42 mm (S).  - Aortic root: The aortic root was mildly dilated.  - Ascending aorta: The ascending aorta was mildly dilated.  - Mitral valve: There was trivial regurgitation.  - Left atrium: The atrium was mildly dilated.  - Right ventricle: The cavity size was moderately dilated. Wall    thickness was normal. Systolic function was moderately reduced.  - Right atrium: The atrium was moderately dilated.  - Atrial septum: The septum bowed from right to left, consistent    with increased right atrial pressure.  - Pulmonic valve: There was mild regurgitation.   Labs/Other Tests and Data Reviewed:    EKG: Already obtained personally reviewed shows sinus rhythm with an old right bundle branch block, no ischemic abnormalities.  Recent Labs: No results found for requested labs within last 365 days.   02/14/2020 Glucose 100,  creatinine 0.91, normal liver function tests  Recent Lipid Panel Lab Results  Component Value Date/Time   CHOL 125 10/28/2017 08:35 AM   TRIG 48 10/28/2017 08:35 AM   HDL 43 10/28/2017 08:35 AM   CHOLHDL 2.9 10/28/2017 08:35 AM   CHOLHDL 4.2 09/06/2017 02:18 AM   LDLCALC 72 10/28/2017 08:35 AM   08/01/2019  total cholesterol 119, HDL 47, LDL 66, triglycerides 49.  02/14/2020 total cholesterol 119, LDL 64, HDL 49, triglycerides 62  02/07/2021 HDL 46, direct LDL 70, triglycerides 49.  Creatinine 0.95, potassium 4.7, normal liver function tests.  Wt Readings from Last 3 Encounters:  10/09/21 221 lb 9.6 oz (100.5 kg)  08/28/20 209 lb 14.4 oz (95.2 kg)  08/16/19 221 lb (100.2 kg)     Objective:    Vital Signs:  BP 126/72 (BP Location: Left Arm, Patient Position: Sitting, Cuff Size: Large)   Pulse 61   Ht '5\' 8"'$  (1.727 m)   Wt 221 lb 9.6 oz (100.5 kg)   SpO2  96%   BMI 33.69 kg/m      General: Alert, oriented x3, no distress, mildly obese, but also appears very sick Head: no evidence of trauma, PERRL, EOMI, no exophtalmos or lid lag, no myxedema, no xanthelasma; normal ears, nose and oropharynx Neck: normal jugular venous pulsations and no hepatojugular reflux; brisk carotid pulses without delay and no carotid bruits Chest: clear to auscultation, no signs of consolidation by percussion or palpation, normal fremitus, symmetrical and full respiratory excursions Cardiovascular: normal position and quality of the apical impulse, regular rhythm, normal first and widely split second heart sounds, no murmurs, rubs or gallops Abdomen: no tenderness or distention, no masses by palpation, no abnormal pulsatility or arterial bruits, normal bowel sounds, no hepatosplenomegaly Extremities: no clubbing, cyanosis or edema; 2+ radial, ulnar and brachial pulses bilaterally; 2+ right femoral, posterior tibial and dorsalis pedis pulses; 2+ left femoral, posterior tibial and dorsalis pedis pulses; no  subclavian or femoral bruits Neurological: grossly nonfocal Psych: Normal mood and affect    ASSESSMENT & PLAN:    1. Coronary artery disease involving native coronary artery of native heart without angina pectoris   2. Hypercholesterolemia   3. Essential hypertension   4. Mild obesity   5. Malignant neoplasm of prostate ([place])       CAD s/p CABG: Asymptomatic.  On aspirin and lipid-lowering therapy, but not on beta-blockers due to resting bradycardia. HLP: All lipid parameters are in target range on statin ezetimibe combo (he tolerates this better than high-dose statin). HTN: Well-controlled. Obesity: He had managed to lose some weight last year, but has unfortunately put it all back on. Prostate Ca: Status post successful surgery and radiation therapy.  Secondary erectile dysfunction.   Patient Instructions  Medication Instructions:  No changes *If you need a refill on your cardiac medications before your next appointment, please call your pharmacy*   Lab Work: None ordered  If you have labs (blood work) drawn today and your tests are completely normal, you will receive your results only by: [place] (if you have MyChart) OR A paper copy in the mail If you have any lab test that is abnormal or we need to change your treatment, we will call you to review the results.   Testing/Procedures: None ordered   Follow-Up: At [facility], you and your health needs are our priority.  As part of our continuing mission to provide you with exceptional heart care, we have created designated Provider Care Teams.  These Care Teams include your primary Cardiologist (physician) and Advanced Practice Providers (APPs -  Physician Assistants and Nurse Practitioners) who all work together to provide you with the care you need, when you need it.  We recommend signing up for the patient portal called "MyChart".  Sign up information is provided on this After Visit Summary.  MyChart is  used to connect with patients for Virtual Visits (Telemedicine).  Patients are able to view lab/test results, encounter notes, upcoming appointments, etc.  Non-urgent messages can be sent to your provider as well.   To learn more about what you can do with MyChart, go to [email or web address].    Your next appointment:   12 month(s)  The format for your next appointment:   In Person  Provider:   Sanda Klein, MD    Important Information About Sugar          Signed, Sanda Klein, MD  10/09/2021 10:33 AM    [place]

## 2021-10-09 NOTE — Patient Instructions (Signed)

## 2021-10-15 DIAGNOSIS — E785 Hyperlipidemia, unspecified: Secondary | ICD-10-CM | POA: Diagnosis not present

## 2021-10-15 DIAGNOSIS — R300 Dysuria: Secondary | ICD-10-CM | POA: Diagnosis not present

## 2021-10-15 DIAGNOSIS — N390 Urinary tract infection, site not specified: Secondary | ICD-10-CM | POA: Diagnosis not present

## 2021-10-15 DIAGNOSIS — I10 Essential (primary) hypertension: Secondary | ICD-10-CM | POA: Diagnosis not present

## 2021-10-30 ENCOUNTER — Other Ambulatory Visit: Payer: Self-pay | Admitting: Cardiovascular Disease

## 2021-10-30 ENCOUNTER — Other Ambulatory Visit (HOSPITAL_COMMUNITY): Payer: Self-pay

## 2021-10-30 ENCOUNTER — Encounter: Payer: Self-pay | Admitting: Cardiovascular Disease

## 2021-10-30 MED ORDER — EZETIMIBE 10 MG PO TABS
10.0000 mg | ORAL_TABLET | Freq: Every day | ORAL | 3 refills | Status: DC
  Filled 2021-10-30: qty 90, 90d supply, fill #0

## 2021-10-30 MED ORDER — ATORVASTATIN CALCIUM 20 MG PO TABS
ORAL_TABLET | Freq: Every day | ORAL | 3 refills | Status: DC
  Filled 2021-10-30: qty 90, 90d supply, fill #0
  Filled 2022-02-06: qty 90, 90d supply, fill #1
  Filled 2022-05-26: qty 90, 90d supply, fill #2
  Filled 2022-09-14: qty 90, 90d supply, fill #3

## 2021-10-30 MED ORDER — EZETIMIBE 10 MG PO TABS
ORAL_TABLET | ORAL | 3 refills | Status: DC
  Filled 2021-10-30: qty 90, 90d supply, fill #0

## 2021-10-30 MED ORDER — DULOXETINE HCL 40 MG PO CPEP
40.0000 mg | ORAL_CAPSULE | Freq: Two times a day (BID) | ORAL | 3 refills | Status: DC
Start: 2021-10-30 — End: 2021-11-10
  Filled 2021-10-30: qty 180, 90d supply, fill #0

## 2021-11-05 DIAGNOSIS — N390 Urinary tract infection, site not specified: Secondary | ICD-10-CM | POA: Diagnosis not present

## 2021-11-06 ENCOUNTER — Other Ambulatory Visit (HOSPITAL_COMMUNITY): Payer: Self-pay

## 2021-11-10 ENCOUNTER — Other Ambulatory Visit (HOSPITAL_COMMUNITY): Payer: Self-pay

## 2021-11-10 MED ORDER — DULOXETINE HCL 20 MG PO CPEP
20.0000 mg | ORAL_CAPSULE | Freq: Two times a day (BID) | ORAL | 5 refills | Status: AC
  Filled 2021-11-10: qty 60, 30d supply, fill #0
  Filled 2021-12-29: qty 60, 30d supply, fill #1
  Filled 2022-02-24: qty 60, 30d supply, fill #2
  Filled 2022-04-27: qty 60, 30d supply, fill #3
  Filled 2022-05-26: qty 60, 30d supply, fill #4
  Filled 2022-09-14: qty 60, 30d supply, fill #5

## 2021-11-14 DIAGNOSIS — K5730 Diverticulosis of large intestine without perforation or abscess without bleeding: Secondary | ICD-10-CM | POA: Diagnosis not present

## 2021-11-14 DIAGNOSIS — Z1211 Encounter for screening for malignant neoplasm of colon: Secondary | ICD-10-CM | POA: Diagnosis not present

## 2021-11-14 DIAGNOSIS — K635 Polyp of colon: Secondary | ICD-10-CM | POA: Diagnosis not present

## 2021-11-14 DIAGNOSIS — D125 Benign neoplasm of sigmoid colon: Secondary | ICD-10-CM | POA: Diagnosis not present

## 2021-11-17 DIAGNOSIS — N393 Stress incontinence (female) (male): Secondary | ICD-10-CM | POA: Diagnosis not present

## 2021-11-17 DIAGNOSIS — N3000 Acute cystitis without hematuria: Secondary | ICD-10-CM | POA: Diagnosis not present

## 2021-11-20 ENCOUNTER — Other Ambulatory Visit (HOSPITAL_COMMUNITY): Payer: Self-pay

## 2021-11-20 MED ORDER — CIPROFLOXACIN HCL 500 MG PO TABS
500.0000 mg | ORAL_TABLET | Freq: Two times a day (BID) | ORAL | 0 refills | Status: AC
  Filled 2021-11-20: qty 14, 7d supply, fill #0

## 2021-11-24 ENCOUNTER — Other Ambulatory Visit (HOSPITAL_COMMUNITY): Payer: Self-pay

## 2021-12-03 ENCOUNTER — Other Ambulatory Visit (HOSPITAL_COMMUNITY): Payer: Self-pay

## 2021-12-03 ENCOUNTER — Other Ambulatory Visit: Payer: Self-pay | Admitting: Cardiovascular Disease

## 2021-12-03 MED ORDER — LISINOPRIL 5 MG PO TABS
ORAL_TABLET | Freq: Every day | ORAL | 3 refills | Status: DC
  Filled 2021-12-03: qty 90, 90d supply, fill #0
  Filled 2022-03-16: qty 90, 90d supply, fill #1
  Filled 2022-06-29: qty 90, 90d supply, fill #2
  Filled 2022-11-16: qty 90, 90d supply, fill #3

## 2021-12-29 ENCOUNTER — Other Ambulatory Visit (HOSPITAL_COMMUNITY): Payer: Self-pay

## 2022-02-06 ENCOUNTER — Other Ambulatory Visit (HOSPITAL_COMMUNITY): Payer: Self-pay

## 2022-02-13 DIAGNOSIS — E782 Mixed hyperlipidemia: Secondary | ICD-10-CM | POA: Diagnosis not present

## 2022-02-13 DIAGNOSIS — I10 Essential (primary) hypertension: Secondary | ICD-10-CM | POA: Diagnosis not present

## 2022-02-17 DIAGNOSIS — C61 Malignant neoplasm of prostate: Secondary | ICD-10-CM | POA: Diagnosis not present

## 2022-02-17 DIAGNOSIS — Z87891 Personal history of nicotine dependence: Secondary | ICD-10-CM | POA: Diagnosis not present

## 2022-02-17 DIAGNOSIS — E782 Mixed hyperlipidemia: Secondary | ICD-10-CM | POA: Diagnosis not present

## 2022-02-17 DIAGNOSIS — Z23 Encounter for immunization: Secondary | ICD-10-CM | POA: Diagnosis not present

## 2022-02-17 DIAGNOSIS — Z0000 Encounter for general adult medical examination without abnormal findings: Secondary | ICD-10-CM | POA: Diagnosis not present

## 2022-02-17 DIAGNOSIS — B9689 Other specified bacterial agents as the cause of diseases classified elsewhere: Secondary | ICD-10-CM | POA: Diagnosis not present

## 2022-02-17 DIAGNOSIS — J208 Acute bronchitis due to other specified organisms: Secondary | ICD-10-CM | POA: Diagnosis not present

## 2022-02-17 DIAGNOSIS — R7301 Impaired fasting glucose: Secondary | ICD-10-CM | POA: Diagnosis not present

## 2022-02-17 DIAGNOSIS — R7303 Prediabetes: Secondary | ICD-10-CM | POA: Diagnosis not present

## 2022-02-24 ENCOUNTER — Other Ambulatory Visit (HOSPITAL_COMMUNITY): Payer: Self-pay

## 2022-03-05 ENCOUNTER — Other Ambulatory Visit (HOSPITAL_COMMUNITY): Payer: Self-pay

## 2022-03-16 ENCOUNTER — Encounter: Payer: Self-pay | Admitting: Cardiovascular Disease

## 2022-03-16 ENCOUNTER — Other Ambulatory Visit: Payer: Self-pay

## 2022-03-16 ENCOUNTER — Other Ambulatory Visit (HOSPITAL_COMMUNITY): Payer: Self-pay

## 2022-03-16 MED ORDER — EZETIMIBE 10 MG PO TABS
10.0000 mg | ORAL_TABLET | Freq: Every day | ORAL | 3 refills | Status: DC
  Filled 2022-03-16: qty 90, 90d supply, fill #0
  Filled 2022-06-29: qty 90, 90d supply, fill #1
  Filled 2022-10-07: qty 90, 90d supply, fill #2
  Filled 2023-01-02: qty 90, 90d supply, fill #3

## 2022-03-18 ENCOUNTER — Other Ambulatory Visit: Payer: Self-pay

## 2022-03-18 ENCOUNTER — Other Ambulatory Visit (HOSPITAL_COMMUNITY): Payer: Self-pay

## 2022-04-06 DIAGNOSIS — L0201 Cutaneous abscess of face: Secondary | ICD-10-CM | POA: Diagnosis not present

## 2022-04-20 DIAGNOSIS — I10 Essential (primary) hypertension: Secondary | ICD-10-CM | POA: Diagnosis not present

## 2022-04-20 DIAGNOSIS — E785 Hyperlipidemia, unspecified: Secondary | ICD-10-CM | POA: Diagnosis not present

## 2022-04-20 DIAGNOSIS — S0502XA Injury of conjunctiva and corneal abrasion without foreign body, left eye, initial encounter: Secondary | ICD-10-CM | POA: Diagnosis not present

## 2022-04-27 ENCOUNTER — Other Ambulatory Visit (HOSPITAL_COMMUNITY): Payer: Self-pay

## 2022-04-27 ENCOUNTER — Other Ambulatory Visit: Payer: Self-pay | Admitting: Cardiovascular Disease

## 2022-04-27 ENCOUNTER — Other Ambulatory Visit: Payer: Self-pay

## 2022-04-27 MED ORDER — CARVEDILOL 3.125 MG PO TABS
3.1250 mg | ORAL_TABLET | Freq: Two times a day (BID) | ORAL | 2 refills | Status: DC
  Filled 2022-04-27: qty 180, 90d supply, fill #0
  Filled 2022-11-11: qty 180, 90d supply, fill #1
  Filled 2023-02-04: qty 180, 90d supply, fill #2

## 2022-05-18 DIAGNOSIS — C61 Malignant neoplasm of prostate: Secondary | ICD-10-CM | POA: Diagnosis not present

## 2022-05-20 DIAGNOSIS — E782 Mixed hyperlipidemia: Secondary | ICD-10-CM | POA: Diagnosis not present

## 2022-05-25 ENCOUNTER — Other Ambulatory Visit (HOSPITAL_COMMUNITY): Payer: Self-pay

## 2022-05-25 DIAGNOSIS — N3000 Acute cystitis without hematuria: Secondary | ICD-10-CM | POA: Diagnosis not present

## 2022-05-25 DIAGNOSIS — C61 Malignant neoplasm of prostate: Secondary | ICD-10-CM | POA: Diagnosis not present

## 2022-05-25 DIAGNOSIS — N5231 Erectile dysfunction following radical prostatectomy: Secondary | ICD-10-CM | POA: Diagnosis not present

## 2022-05-25 DIAGNOSIS — N393 Stress incontinence (female) (male): Secondary | ICD-10-CM | POA: Diagnosis not present

## 2022-05-26 ENCOUNTER — Other Ambulatory Visit: Payer: Self-pay

## 2022-05-26 ENCOUNTER — Other Ambulatory Visit (HOSPITAL_COMMUNITY): Payer: Self-pay

## 2022-05-26 MED ORDER — DULOXETINE HCL 40 MG PO CPEP
40.0000 mg | ORAL_CAPSULE | Freq: Two times a day (BID) | ORAL | 3 refills | Status: DC
  Filled 2022-05-26: qty 180, 90d supply, fill #0

## 2022-05-27 ENCOUNTER — Other Ambulatory Visit (HOSPITAL_COMMUNITY): Payer: Self-pay

## 2022-05-27 ENCOUNTER — Other Ambulatory Visit: Payer: Self-pay

## 2022-05-29 ENCOUNTER — Other Ambulatory Visit (HOSPITAL_COMMUNITY): Payer: Self-pay

## 2022-06-29 ENCOUNTER — Other Ambulatory Visit (HOSPITAL_COMMUNITY): Payer: Self-pay

## 2022-09-14 ENCOUNTER — Other Ambulatory Visit (HOSPITAL_COMMUNITY): Payer: Self-pay

## 2022-10-07 ENCOUNTER — Other Ambulatory Visit (HOSPITAL_COMMUNITY): Payer: Self-pay

## 2022-10-11 NOTE — Progress Notes (Unsigned)
Cardiology Clinic Note   Patient Name: Brendan Jackson Date of Encounter: 10/13/2022  Primary Care Provider:  Olive Bass, MD Primary Cardiologist:  Thurmon Fair, MD  Patient Profile     Brendan Jackson 72 year old male presents to the clinic today for follow-up evaluation of his coronary artery disease and hyperlipidemia.  Past Medical History    Past Medical History:  Diagnosis Date   Hyperlipidemia LDL goal <70    Hypertension    NSTEMI (non-ST elevated myocardial infarction) ([facility]) 09/05/2017   Prostate cancer [facility])    Past Surgical History:  Procedure Laterality Date   CATARACT EXTRACTION  2015   CORONARY ARTERY BYPASS GRAFT N/A 09/09/2017   Procedure: CORONARY ARTERY BYPASS GRAFTING (CABG) x 4, USING LEFT INTERNAL MAMMARY ARTERY AND RIGHT AND LEFT GREATER SAPHENOUS VEIN HARVESTED ENDOSCOPICALLY;  Surgeon: Kerin Perna, MD;  Location: [facility];  Service: Open Heart Surgery;  Laterality: N/A;   IR THORACENTESIS ASP PLEURAL SPACE W/IMG GUIDE  10/22/2017   LEFT HEART CATH AND CORONARY ANGIOGRAPHY N/A 09/06/2017   Procedure: LEFT HEART CATH AND CORONARY ANGIOGRAPHY;  Surgeon: Runell Gess, MD;  [facility];  Service: Cardiovascular;  Laterality: N/A;   LYMPHADENECTOMY Bilateral 12/15/2017   Procedure: LYMPHADENECTOMY;  Surgeon: Sebastian Ache, MD;  [facility];  Service: Urology;  Laterality: Bilateral;   PROSTATE BIOPSY  05/13/2017   ROBOT ASSISTED LAPAROSCOPIC RADICAL PROSTATECTOMY N/A 12/15/2017   Procedure: XI ROBOTIC ASSISTED LAPAROSCOPIC RADICAL PROSTATECTOMY;  Surgeon: Sebastian Ache, MD;  [facility];  Service: Urology;  Laterality: N/A;   TEE WITHOUT CARDIOVERSION N/A 09/09/2017   Procedure: TRANSESOPHAGEAL ECHOCARDIOGRAM (TEE);  Surgeon: Donata Clay, Theron Arista, MD;  Location: [facility] OR;  Service: Open Heart Surgery;  Laterality: N/A;   TENDON REPAIR Right 1987   Thumb   TONSILLECTOMY      Allergies  No Known  Allergies  History of Present Illness     Brendan Jackson has a PMH of coronary artery disease, NSTEMI 7/19 which led to CABG (LIMA-LAD SVG-ramus intermedius, SVG-OM, SVG-PDA by Dr. Maren Beach, HTN, ED, prostate cancer status post surgery and radiation therapy.  Echocardiogram 10/19 showed an EF of 55-60%, moderately dilated ascending aortic aneurysm 4.2 cm and mild biatrial dilation.  He is a Freight forwarder.  He was seen in follow-up by Dr. Royann Shivers on 10/09/2021.  During that time he denied cardiac complaints.  He denied exertional dyspnea.  He denied unexplained weight gain and cough.  Follow-up was planned for 12 months.  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  He was carrying an trays of small chickens today that way 35-40 pounds and denies chest pain with this increase physical activity.  He presents with his wife.  We reviewed his last cardiology visit and open heart surgery.  They expressed understanding.  He has his annual labs and cholesterol drawn with his PCP.  He has been eating a heart healthy diet.  I will repeat echocardiogram to evaluate ascending aortic aneurysm and plan follow-up in 12 months.  Today he denies chest pain, shortness of breath, lower extremity edema, fatigue, palpitations, melena, hematuria, hemoptysis, diaphoresis, weakness, presyncope, syncope, orthopnea, and PND.    Home Medications    Prior to Admission medications   Medication Sig Start Date End Date Taking? Authorizing Provider  aspirin EC 81 MG tablet Take 81 mg by mouth daily.    [provider]  atorvastatin (LIPITOR) 20 MG tablet TAKE 1 TABLET BY MOUTH ONCE DAILY 10/30/21 12/13/22  Croitoru, Rachelle Hora, MD  carvedilol (COREG) 3.125 MG tablet Take 1 tablet (3.125 mg total) by mouth 2 (two) times daily with a meal. 04/27/22   Croitoru, Mihai, MD  DULoxetine (CYMBALTA) 20 MG capsule TAKE 2 CAPSULES BY MOUTH 2  TIMES DAILY 04/22/20 10/09/21  Manny, Delbert Phenix., MD  DULoxetine (CYMBALTA) 20 MG capsule Take 1 capsule (20 mg total) by mouth 2 (two) times daily. 11/10/21     DULoxetine HCl 40 MG CPEP Take 1 capsule (40 mg total) by mouth 2 (two) times daily. 05/25/22     ezetimibe (ZETIA) 10 MG tablet Take 1 tablet by mouth daily. 03/16/22   Croitoru, Mihai, MD  lisinopril (ZESTRIL) 5 MG tablet TAKE 1 TABLET BY MOUTH DAILY. 12/03/21 12/03/22  Croitoru, Mihai, MD  Magnesium 400 MG CAPS Take 400 mg by mouth daily. Patient taking differently: Take 500 mg by mouth daily. 08/28/20   Croitoru, Mihai, MD  polyethylene glycol (GOLYTELY) 236 g solution Take as directed following office directions. DO NOT FOLLOW DIRECTIONS INCLUDED WITH PREP Patient not taking: Reported on 10/09/2021 09/18/21       Family History    Family History  Problem Relation Age of Onset   Cancer Paternal Aunt        breast   Cancer Paternal Uncle        prostate and brain   Cancer Paternal Uncle        head and neck ca with lung involvement   Cancer Paternal Aunt        breast   Cancer Father        pancreatic   He indicated that the status of his father is unknown.  Social History    Social History   Socioeconomic History   Marital status: Married    Spouse name: Bjorn Loser   Number of children: 1   Years of education: Not on file   Highest education level: Not on file  Occupational History    Comment: chicken and cattle farmer  Tobacco Use   Smoking status: Never   Smokeless tobacco: Former    Types: Chew    Quit date: 03/02/2013  Vaping Use   Vaping status: Never Used  Substance and Sexual Activity   Alcohol use: Yes    Alcohol/week: 1.0 standard drink of alcohol    Types: 1 Cans of beer per week    Comment: occas   Drug use: Never   Sexual activity: Yes  Other Topics Concern   Not on file  Social History Narrative   Resides in [place] with his wife, grown daughter and dogs, [place] Smoker, [place] and [place] passed last summer.     Social Determinants of Health   Financial Resource Strain: Low Risk  (02/07/2020)   [facility] prior to 05/02/2022., [facility] visits prior to 05/02/2022.   Overall Financial Resource Strain (CARDIA)    Difficulty of Paying Living Expenses: Not hard at all  Food Insecurity: No Food Insecurity (11/05/2021)   [facility] from [facility] visits prior to 05/02/2022., [facility] to [facility]   Hunger Vital Sign    Worried About Running Out of Food in the Last Year: Never true    Ran Out of Food in the Last Year: Never true  Transportation Needs: No  Transportation Needs (11/05/2021)   Received from [facility] visits prior to 05/02/2022., [facility] to [facility]02/[facility]   PRAPARE - Transportation    Lack of Transportation (Medical): No    Lack of Transportation (Non-Medical): No  Physical Activity: Sufficiently Active (02/07/2020)   [facility] from [facility] visits prior to 05/02/2022., [facility] visits prior to 05/02/2022.   Exercise Vital Sign    Days of Exercise per Week: 6 days    Minutes of Exercise per Session: 60 min  Stress: No Stress Concern Present (02/07/2020)   [facility] prior to 05/02/2022., [facility] visits prior to 05/02/2022.   [employer] of Occupational Health - Occupational Stress Questionnaire    Feeling of Stress : Not at all  Social Connections: Unknown (02/07/2020)   [facility] visits prior to 05/02/2022., [facility] visits prior to 05/02/2022.   Social Advertising account executive [NHANES]    Frequency of Communication with Friends and Family: Twice a week    Frequency of Social  Gatherings with Friends and Family: Once a week    Attends Religious Services: 1 to 4 times per year    Active Member of [employer] or Organizations: Not on file    Attends Banker Meetings: Not on file    Marital Status: Not on file  Intimate Partner Violence: Not At Risk (02/07/2020)   [facility] prior to 05/02/2022., [facility] visits prior to 05/02/2022.   Humiliation, Afraid, Rape, and Kick questionnaire    Fear of Current or Ex-Partner: No    Emotionally Abused: No    Physically Abused: No    Sexually Abused: No     Review of Systems    General:  No chills, fever, night sweats or weight changes.  Cardiovascular:  No chest pain, dyspnea on exertion, edema, orthopnea, palpitations, paroxysmal nocturnal dyspnea. Dermatological: No rash, lesions/masses Respiratory: No cough, dyspnea Urologic: No hematuria, dysuria Abdominal:   No nausea, vomiting, diarrhea, bright red blood per rectum, melena, or hematemesis Neurologic:  No visual changes, wkns, changes in mental status. All other systems reviewed and are otherwise negative except as noted above.  Physical Exam    VS:  BP 132/76   Pulse (!) 59   Ht 5\' 8"  (1.727 m)   Wt 219 lb (99.3 kg)   SpO2 97%   BMI 33.30 kg/m  , BMI Body mass index is 33.3 kg/m. GEN: Well nourished, well developed, in no acute distress. HEENT: normal. Neck: Supple, no JVD, carotid bruits, or masses. Cardiac: RRR, no murmurs, rubs, or gallops. No clubbing, cyanosis, edema.  Radials/DP/PT 2+ and equal bilaterally.  Respiratory:  Respirations regular and unlabored, clear to auscultation bilaterally. GI: Soft, nontender, nondistended, BS + x 4. MS: no deformity or atrophy. Skin: warm and dry, no rash. Neuro:  Strength and sensation are intact. Psych: Normal affect.  Accessory Clinical Findings    Recent Labs: No results found for requested labs within last 365 days.   Recent  Lipid Panel    Component Value Date/Time   CHOL 125 10/28/2017 0835   TRIG 48 10/28/2017 0835   HDL 43 10/28/2017 0835   CHOLHDL 2.9 10/28/2017 0835   CHOLHDL 4.2 09/06/2017 0218   VLDL 12 09/06/2017 0218  LDLCALC 72 10/28/2017 0835         ECG personally reviewed by me today- EKG Interpretation Date/Time:  Tuesday October 13 2022 15:53:16 EDT Ventricular Rate:  59 PR Interval:  204 QRS Duration:  162 QT Interval:  460 QTC Calculation: 455 R Axis:   -10  Text Interpretation: Sinus bradycardia Right bundle branch block Confirmed by Edd Fabian [phone]) on 10/13/2022 4:01:03 PM   Echocardiogram 12/07/2017   Study Conclusions   - Left ventricle: The cavity size was normal. There was mild    concentric hypertrophy. Systolic function was normal. The    estimated ejection fraction was in the range of 55% to 60%. Wall    motion was normal; there were no regional wall motion    abnormalities.  - Aortic valve: Trileaflet; mildly thickened, mildly calcified    leaflets. There was mild regurgitation.  - Aorta: Aortic root dimension: 39 mm (ED). Ascending aortic    diameter: 42 mm (S).  - Aortic root: The aortic root was mildly dilated.  - Ascending aorta: The ascending aorta was mildly dilated.  - Mitral valve: There was trivial regurgitation.  - Left atrium: The atrium was mildly dilated.  - Right ventricle: The cavity size was moderately dilated. Wall    thickness was normal. Systolic function was moderately reduced.  - Right atrium: The atrium was moderately dilated.  - Atrial septum: The septum bowed from right to left, consistent    with increased right atrial pressure.  - Pulmonic valve: There was mild regurgitation.      Assessment & Plan   1.  Coronary artery disease-status post CABG.  Denies chest pain.  Denies exertional chest pain.  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 Continue aspirin, atorvastatin, carvedilol, ezetimibe,  lisinopril Heart healthy low-sodium diet Maintain physical activity  Ascending Aortic Aneurysm- 4.2 cm in 10/19.  Maintain good BP control Repeat Echo  Hyperlipidemia-LDL 72 on 10/28/17. High-fiber diet Continue aspirin, atorvastatin Follows with PCP  Essential hypertension-BP today 132/76. Maintain blood pressure log Continue carvedilol, lisinopril Labs with PCP  Prostate cancer-status post prostatectomy and radiation therapy. Follows with PCP  Obesity-weight today 219 lbs. Maintain physical activity Continue reduced calorie diet Continue weight loss  Disposition: Follow-up with Dr. Royann Shivers or me in 12 months.   Thomasene Ripple.  NP-C     10/13/2022, 4:01 PM [facility] [address] Office [phone] Fax [phone]    I spent 13 minutes examining this patient, reviewing medications, and using patient centered shared decision making involving her cardiac care.  Prior to her visit I spent greater than 20 minutes reviewing her past medical history,  medications, and prior cardiac tests.

## 2022-10-13 ENCOUNTER — Encounter: Payer: Self-pay | Admitting: General Practice

## 2022-10-13 ENCOUNTER — Ambulatory Visit: Payer: 59 | Attending: General Practice | Admitting: General Practice

## 2022-10-13 VITALS — BP 132/76 | HR 59 | Ht 68.0 in | Wt 219.0 lb

## 2022-10-13 DIAGNOSIS — I2510 Atherosclerotic heart disease of native coronary artery without angina pectoris: Secondary | ICD-10-CM | POA: Diagnosis not present

## 2022-10-13 DIAGNOSIS — I10 Essential (primary) hypertension: Secondary | ICD-10-CM

## 2022-10-13 DIAGNOSIS — E669 Obesity, unspecified: Secondary | ICD-10-CM

## 2022-10-13 DIAGNOSIS — E7800 Pure hypercholesterolemia, unspecified: Secondary | ICD-10-CM | POA: Diagnosis not present

## 2022-10-13 DIAGNOSIS — I7121 Aneurysm of the ascending aorta, without rupture: Secondary | ICD-10-CM | POA: Diagnosis not present

## 2022-10-13 DIAGNOSIS — C61 Malignant neoplasm of prostate: Secondary | ICD-10-CM

## 2022-10-13 NOTE — Patient Instructions (Signed)
Medication Instructions:  The current medical regimen is effective;  continue present plan and medications as directed. Please refer to the Current Medication list given to you today.  *If you need a refill on your cardiac medications before your next appointment, please call your pharmacy*  Lab Work: NONE If you have labs (blood work) drawn today and your  Testing/Procedures: Your physician has requested that you have an echocardiogram. Echocardiography is a painless test that uses sound waves to create images of your heart. It provides your doctor with information about the size and shape of your heart and how well your heart's chambers and valves are working. This procedure takes approximately one hour. There are no restrictions for this procedure. Please do NOT wear cologne, perfume, aftershave, or lotions (deodorant is allowed). Please arrive 15 minutes prior to your appointment time.   Other Instructions CONTINUE PHYSICAL ACTIVITY  Follow-Up: At [facility], you and your health needs are our priority.  As part of our continuing mission to provide you with exceptional heart care, we have created designated Provider Care Teams.  These Care Teams include your primary Cardiologist (physician) and Advanced Practice Providers (APPs -  Physician Assistants and Nurse Practitioners) who all work together to provide you with the care you need, when you need it.  Your next appointment:   12 month(s)  Provider:   Thurmon Fair, MD

## 2022-11-11 ENCOUNTER — Other Ambulatory Visit: Payer: Self-pay

## 2022-11-11 ENCOUNTER — Other Ambulatory Visit (HOSPITAL_COMMUNITY): Payer: Self-pay

## 2022-11-16 ENCOUNTER — Other Ambulatory Visit: Payer: Self-pay

## 2022-11-17 ENCOUNTER — Ambulatory Visit: Payer: 59 | Attending: General Practice

## 2022-11-17 DIAGNOSIS — I7121 Aneurysm of the ascending aorta, without rupture: Secondary | ICD-10-CM

## 2022-11-17 LAB — ECHOCARDIOGRAM COMPLETE
Area-P 1/2: 3.32 cm2
P 1/2 time: 519 ms
S' Lateral: 3.1 cm

## 2022-11-25 ENCOUNTER — Other Ambulatory Visit (HOSPITAL_COMMUNITY): Payer: Self-pay

## 2022-11-26 ENCOUNTER — Other Ambulatory Visit (HOSPITAL_COMMUNITY): Payer: Self-pay

## 2022-11-26 MED ORDER — DULOXETINE HCL 20 MG PO CPEP
40.0000 mg | ORAL_CAPSULE | Freq: Two times a day (BID) | ORAL | 3 refills | Status: AC
  Filled 2022-11-26: qty 180, 90d supply, fill #0

## 2022-12-02 ENCOUNTER — Other Ambulatory Visit (HOSPITAL_COMMUNITY): Payer: Self-pay

## 2022-12-14 ENCOUNTER — Other Ambulatory Visit: Payer: Self-pay | Admitting: Cardiovascular Disease

## 2022-12-15 ENCOUNTER — Other Ambulatory Visit (HOSPITAL_COMMUNITY): Payer: Self-pay

## 2022-12-15 ENCOUNTER — Other Ambulatory Visit: Payer: Self-pay

## 2022-12-15 MED ORDER — ATORVASTATIN CALCIUM 20 MG PO TABS
20.0000 mg | ORAL_TABLET | Freq: Every day | ORAL | 3 refills | Status: DC
  Filled 2022-12-15: qty 90, 90d supply, fill #0
  Filled 2023-07-07: qty 90, 90d supply, fill #2
  Filled 2023-10-13: qty 90, 90d supply, fill #3

## 2023-01-04 ENCOUNTER — Other Ambulatory Visit: Payer: Self-pay

## 2023-01-19 ENCOUNTER — Other Ambulatory Visit (HOSPITAL_COMMUNITY): Payer: Self-pay

## 2023-02-04 ENCOUNTER — Other Ambulatory Visit: Payer: Self-pay

## 2023-02-04 ENCOUNTER — Other Ambulatory Visit (HOSPITAL_COMMUNITY): Payer: Self-pay

## 2023-02-07 ENCOUNTER — Other Ambulatory Visit: Payer: Self-pay | Admitting: Cardiovascular Disease

## 2023-02-09 ENCOUNTER — Other Ambulatory Visit: Payer: Self-pay

## 2023-02-09 ENCOUNTER — Other Ambulatory Visit (HOSPITAL_COMMUNITY): Payer: Self-pay

## 2023-02-09 MED ORDER — LISINOPRIL 5 MG PO TABS
5.0000 mg | ORAL_TABLET | Freq: Every day | ORAL | 3 refills | Status: DC
  Filled 2023-02-09: qty 90, 90d supply, fill #0
  Filled 2023-06-14: qty 90, 90d supply, fill #1
  Filled 2023-09-08: qty 90, 90d supply, fill #2
  Filled 2023-12-07: qty 90, 90d supply, fill #3

## 2023-03-31 ENCOUNTER — Other Ambulatory Visit (HOSPITAL_COMMUNITY): Payer: Self-pay

## 2023-04-12 ENCOUNTER — Other Ambulatory Visit (HOSPITAL_COMMUNITY): Payer: Self-pay

## 2023-04-22 ENCOUNTER — Other Ambulatory Visit (HOSPITAL_COMMUNITY): Payer: Self-pay

## 2023-04-26 ENCOUNTER — Other Ambulatory Visit (HOSPITAL_COMMUNITY): Payer: Self-pay

## 2023-04-26 ENCOUNTER — Other Ambulatory Visit: Payer: Self-pay

## 2023-04-26 ENCOUNTER — Other Ambulatory Visit: Payer: Self-pay | Admitting: Cardiovascular Disease

## 2023-04-26 MED ORDER — EZETIMIBE 10 MG PO TABS
10.0000 mg | ORAL_TABLET | Freq: Every day | ORAL | 3 refills | Status: AC
  Filled 2023-04-26: qty 90, 90d supply, fill #0
  Filled 2023-08-09: qty 90, 90d supply, fill #1
  Filled 2023-10-30: qty 90, 90d supply, fill #2
  Filled 2024-02-01: qty 90, 90d supply, fill #3

## 2023-05-11 ENCOUNTER — Other Ambulatory Visit (HOSPITAL_COMMUNITY): Payer: Self-pay

## 2023-06-14 ENCOUNTER — Other Ambulatory Visit (HOSPITAL_COMMUNITY): Payer: Self-pay

## 2023-06-14 ENCOUNTER — Other Ambulatory Visit: Payer: Self-pay | Admitting: Cardiovascular Disease

## 2023-06-15 ENCOUNTER — Other Ambulatory Visit (HOSPITAL_COMMUNITY): Payer: Self-pay

## 2023-06-15 ENCOUNTER — Other Ambulatory Visit: Payer: Self-pay

## 2023-06-15 MED ORDER — CARVEDILOL 3.125 MG PO TABS
3.1250 mg | ORAL_TABLET | Freq: Two times a day (BID) | ORAL | 0 refills | Status: DC
  Filled 2023-06-15: qty 180, 90d supply, fill #0

## 2023-06-21 ENCOUNTER — Other Ambulatory Visit (HOSPITAL_COMMUNITY): Payer: Self-pay

## 2023-06-21 ENCOUNTER — Other Ambulatory Visit: Payer: Self-pay

## 2023-06-21 MED ORDER — DULOXETINE HCL 20 MG PO CPEP
40.0000 mg | ORAL_CAPSULE | Freq: Two times a day (BID) | ORAL | 11 refills | Status: AC
  Filled 2023-06-21 (×2): qty 120, 30d supply, fill #0
  Filled 2023-08-23: qty 120, 30d supply, fill #1
  Filled 2023-10-30: qty 120, 30d supply, fill #2
  Filled 2023-12-07: qty 120, 30d supply, fill #3
  Filled 2024-03-06: qty 120, 30d supply, fill #4

## 2023-07-07 ENCOUNTER — Other Ambulatory Visit (HOSPITAL_COMMUNITY): Payer: Self-pay

## 2023-08-09 ENCOUNTER — Other Ambulatory Visit (HOSPITAL_COMMUNITY): Payer: Self-pay

## 2023-08-23 ENCOUNTER — Other Ambulatory Visit (HOSPITAL_COMMUNITY): Payer: Self-pay

## 2023-09-08 ENCOUNTER — Other Ambulatory Visit: Payer: Self-pay | Admitting: Cardiovascular Disease

## 2023-09-09 ENCOUNTER — Other Ambulatory Visit (HOSPITAL_COMMUNITY): Payer: Self-pay

## 2023-09-09 ENCOUNTER — Other Ambulatory Visit: Payer: Self-pay

## 2023-09-09 MED ORDER — CARVEDILOL 3.125 MG PO TABS
3.1250 mg | ORAL_TABLET | Freq: Two times a day (BID) | ORAL | 2 refills | Status: AC
  Filled 2023-09-09: qty 180, 90d supply, fill #0
  Filled 2024-01-26: qty 180, 90d supply, fill #1

## 2023-10-13 ENCOUNTER — Other Ambulatory Visit (HOSPITAL_COMMUNITY): Payer: Self-pay

## 2023-10-30 ENCOUNTER — Other Ambulatory Visit (HOSPITAL_COMMUNITY): Payer: Self-pay

## 2023-12-07 ENCOUNTER — Other Ambulatory Visit (HOSPITAL_COMMUNITY): Payer: Self-pay

## 2024-01-26 ENCOUNTER — Other Ambulatory Visit: Payer: Self-pay | Admitting: Cardiovascular Disease

## 2024-01-26 ENCOUNTER — Other Ambulatory Visit: Payer: Self-pay

## 2024-01-26 ENCOUNTER — Other Ambulatory Visit (HOSPITAL_COMMUNITY): Payer: Self-pay

## 2024-01-26 DIAGNOSIS — I2510 Atherosclerotic heart disease of native coronary artery without angina pectoris: Secondary | ICD-10-CM

## 2024-01-31 MED ORDER — ATORVASTATIN CALCIUM 20 MG PO TABS
20.0000 mg | ORAL_TABLET | Freq: Every day | ORAL | 0 refills | Status: AC
  Filled 2024-01-31: qty 90, 90d supply, fill #0

## 2024-02-01 ENCOUNTER — Other Ambulatory Visit (HOSPITAL_COMMUNITY): Payer: Self-pay

## 2024-02-01 ENCOUNTER — Other Ambulatory Visit: Payer: Self-pay

## 2024-02-04 ENCOUNTER — Other Ambulatory Visit (HOSPITAL_BASED_OUTPATIENT_CLINIC_OR_DEPARTMENT_OTHER): Payer: Self-pay

## 2024-02-04 ENCOUNTER — Ambulatory Visit (INDEPENDENT_AMBULATORY_CARE_PROVIDER_SITE_OTHER): Admit: 2024-02-04 | Discharge: 2024-02-04 | Disposition: A | Admitting: Radiology

## 2024-02-04 ENCOUNTER — Encounter (HOSPITAL_BASED_OUTPATIENT_CLINIC_OR_DEPARTMENT_OTHER): Payer: Self-pay

## 2024-02-04 ENCOUNTER — Ambulatory Visit (HOSPITAL_BASED_OUTPATIENT_CLINIC_OR_DEPARTMENT_OTHER)
Admission: RE | Admit: 2024-02-04 | Discharge: 2024-02-04 | Disposition: A | Discharge status: Home / Self Care | Source: Ambulatory Visit | Attending: Family Medicine

## 2024-02-04 VITALS — BP 149/78 | HR 58 | Temp 98.4°F | Resp 20

## 2024-02-04 DIAGNOSIS — R051 Acute cough: Secondary | ICD-10-CM

## 2024-02-04 DIAGNOSIS — J209 Acute bronchitis, unspecified: Secondary | ICD-10-CM | POA: Diagnosis not present

## 2024-02-04 LAB — POC COVID19/FLU A&B COMBO
Covid Antigen, POC: NEGATIVE
Influenza A Antigen, POC: NEGATIVE
Influenza B Antigen, POC: NEGATIVE

## 2024-02-04 MED ORDER — HYDROCODONE BIT-HOMATROP MBR 5-1.5 MG/5ML PO SOLN
5.0000 mL | Freq: Four times a day (QID) | ORAL | 0 refills | Status: AC | PRN
  Filled 2024-02-04: qty 120, 6d supply, fill #0

## 2024-02-04 MED ORDER — DEXAMETHASONE SOD PHOSPHATE PF 10 MG/ML IJ SOLN
10.0000 mg | Freq: Once | INTRAMUSCULAR | Status: AC
  Administered 2024-02-04: 10 mg via INTRAMUSCULAR

## 2024-02-04 MED ORDER — AZITHROMYCIN 250 MG PO TABS
ORAL_TABLET | ORAL | 0 refills | Status: AC
  Filled 2024-02-04: qty 6, 5d supply, fill #0

## 2024-02-04 NOTE — ED Provider Notes (Signed)
 [facility]    CSN: [id number] Arrival date & time: 02/04/24  1528      History   Chief Complaint Chief Complaint  Patient presents with   URI    excessive productive cough, low grade fever (he has hx of quad CABG 6 years ago). - Entered by patient   Cough   Fever    HPI Brendan Jackson is a 73 y.o. male.   Patient is a 73 year old male who presents today with productive cough.  Patient states onset of cough on Sunday. Low grade fever. Patient has hx of CABG x 4 6 6  years ago. Patient denies  sinus drainage. Has not received flu vaccination    URI Presenting symptoms: cough and fever   Cough Associated symptoms: fever   Fever Associated symptoms: cough     Past Medical History:  Diagnosis Date   Hyperlipidemia LDL goal <70    Hypertension    NSTEMI (non-ST elevated myocardial infarction) (HCC) 09/05/2017   Prostate cancer [facility])     Patient Active Problem List   Diagnosis Date Noted   Prostate cancer (HCC) 12/15/2017   Coronary artery disease involving native coronary artery of native heart without angina pectoris 11/04/2017   Hypercholesterolemia 11/04/2017   Mild obesity 11/04/2017   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   NSTEMI (non-ST elevated myocardial infarction) (HCC) 09/06/2017   Essential hypertension 09/06/2017   Malignant neoplasm of prostate (HCC) 06/21/2017    Past Surgical History:  Procedure Laterality Date   CATARACT EXTRACTION  2015   CORONARY ARTERY BYPASS GRAFT N/A 09/09/2017   Procedure: CORONARY ARTERY BYPASS GRAFTING (CABG) x 4, USING LEFT INTERNAL MAMMARY ARTERY AND RIGHT AND LEFT GREATER SAPHENOUS VEIN HARVESTED ENDOSCOPICALLY;  Surgeon: Fleeta Hanford Coy, MD;  Location: [facility] OR;  Service: Open Heart Surgery;  Laterality: N/A;   IR THORACENTESIS ASP PLEURAL SPACE W/IMG GUIDE  10/22/2017   LEFT HEART CATH AND CORONARY ANGIOGRAPHY N/A 09/06/2017   Procedure: LEFT HEART CATH AND CORONARY ANGIOGRAPHY;  Surgeon: Court Dorn PARAS, MD;   Location: [facility];  Service: Cardiovascular;  Laterality: N/A;   LYMPHADENECTOMY Bilateral 12/15/2017   Procedure: LYMPHADENECTOMY;  Surgeon: Alvaro Hummer, MD;  Location: [facility] ORS;  Service: Urology;  Laterality: Bilateral;   PROSTATE BIOPSY  05/13/2017   ROBOT ASSISTED LAPAROSCOPIC RADICAL PROSTATECTOMY N/A 12/15/2017   Procedure: XI ROBOTIC ASSISTED LAPAROSCOPIC RADICAL PROSTATECTOMY;  Surgeon: Alvaro Hummer, MD;  Location: [facility] ORS;  Service: Urology;  Laterality: N/A;   TEE WITHOUT CARDIOVERSION N/A 09/09/2017   Procedure: TRANSESOPHAGEAL ECHOCARDIOGRAM (TEE);  Surgeon: Fleeta Hanford, Coy, MD;  Location: [facility] OR;  Service: Open Heart Surgery;  Laterality: N/A;   TENDON REPAIR Right 1987   Thumb   TONSILLECTOMY         Home Medications    Prior to Admission medications   Medication Sig Start Date End Date Taking? Authorizing Provider  azithromycin  (ZITHROMAX ) 250 MG tablet Take 2 tablets (500 mg total) by mouth daily for 1 day, THEN 1 tablet (250 mg total) daily for 4 days. 02/04/24 02/09/24 Yes Kamylle Axelson A, FNP  HYDROcodone  bit-homatropine (HYCODAN) 5-1.5 MG/5ML syrup Take 5 mLs by mouth every 6 (six) hours as needed for cough. 02/04/24  Yes Lynea Rollison A, FNP  aspirin  EC 81 MG tablet Take 81 mg by mouth daily.    [provider]  atorvastatin  (LIPITOR ) 20 MG tablet Take 1 tablet (20 mg total) by mouth daily. 01/31/24   Croitoru, Mihai, MD  carvedilol  (  COREG ) 3.125 MG tablet Take 1 tablet (3.125 mg total) by mouth 2 (two) times daily with a meal. 09/09/23   Croitoru, Mihai, MD  DULoxetine  (CYMBALTA ) 20 MG capsule Take 1 capsule (20 mg total) by mouth 2 (two) times daily. 11/10/21     DULoxetine  (CYMBALTA ) 20 MG capsule Take 2 capsules (40 mg total) by mouth 2 (two) times daily. 06/21/23     DULoxetine  (CYMBALTA ) 20 MG capsule Take 2 capsules (40 mg total) by mouth 2 (two) times daily. 11/25/22     ezetimibe  (ZETIA ) 10 MG tablet Take 1 tablet by mouth daily. 04/26/23    Emelia Josefa HERO, NP  lisinopril  (ZESTRIL ) 5 MG tablet Take 1 tablet (5 mg total) by mouth daily. 02/09/23 03/11/24  Croitoru, Mihai, MD  Magnesium  400 MG CAPS Take 400 mg by mouth daily. Patient taking differently: Take 500 mg by mouth daily. 08/28/20   Croitoru, Mihai, MD    Family History Family History  Problem Relation Age of Onset   Cancer Paternal Aunt        breast   Cancer Paternal Uncle        prostate and brain   Cancer Paternal Uncle        head and neck ca with lung involvement   Cancer Paternal Aunt        breast   Cancer Father        pancreatic    Social History Social History   Tobacco Use   Smoking status: Never   Smokeless tobacco: Former    Types: Chew    Quit date: 03/02/2013  Vaping Use   Vaping status: Never Used  Substance Use Topics   Alcohol use: Yes    Alcohol/week: 1.0 standard drink of alcohol    Types: 1 Cans of beer per week    Comment: occas   Drug use: Never     Allergies   Patient has no known allergies.   Review of Systems Review of Systems  Constitutional:  Positive for fever.  Respiratory:  Positive for cough.      Physical Exam Triage Vital Signs ED Triage Vitals  Encounter Vitals Group     BP 02/04/24 1554 (!) 149/78     Girls Systolic BP Percentile --      Girls Diastolic BP Percentile --      Boys Systolic BP Percentile --      Boys Diastolic BP Percentile --      Pulse Rate 02/04/24 1554 (!) 58     Resp 02/04/24 1554 20     Temp 02/04/24 1554 98.4 F (36.9 C)     Temp src --      SpO2 02/04/24 1554 91 %     Weight --      Height --      Head Circumference --      Peak Flow --      Pain Score 02/04/24 1557 0     Pain Loc --      Pain Education --      Exclude from Growth Chart --    No data found.  Updated Vital Signs BP (!) 149/78 (BP Location: Right Arm)   Pulse (!) 58   Temp 98.4 F (36.9 C)   Resp 20   SpO2 91%   Visual Acuity Right Eye Distance:   Left Eye Distance:   Bilateral Distance:     Right Eye Near:   Left Eye Near:    Bilateral Near:  Physical Exam Constitutional:      General: He is not in acute distress.    Appearance: Normal appearance. He is not ill-appearing, toxic-appearing or diaphoretic.  HENT:     Right Ear: Tympanic membrane, ear canal and external ear normal.     Left Ear: Tympanic membrane, ear canal and external ear normal.     Mouth/Throat:     Pharynx: Oropharynx is clear.  Eyes:     Conjunctiva/sclera: Conjunctivae normal.  Cardiovascular:     Rate and Rhythm: Normal rate and regular rhythm.     Pulses: Normal pulses.     Heart sounds: Normal heart sounds.  Pulmonary:     Effort: Pulmonary effort is normal.     Breath sounds: Wheezing and rhonchi present.  Musculoskeletal:        General: Normal range of motion.  Skin:    General: Skin is warm and dry.  Neurological:     Mental Status: He is alert.  Psychiatric:        Mood and Affect: Mood normal.      UC Treatments / Results  Labs (all labs ordered are listed, but only abnormal results are displayed) Labs Reviewed  POC COVID19/FLU A&B COMBO - Normal    EKG   Radiology DG Chest 2 View Result Date: 02/04/2024 CLINICAL DATA:  Cough EXAM: CHEST - 2 VIEW COMPARISON:  01/05/2018 FINDINGS: Frontal and lateral views of the chest demonstrate a stable cardiac silhouette. Postsurgical changes from prior CABG. No acute airspace disease, effusion, or pneumothorax. No acute bony abnormalities. IMPRESSION: 1. Stable chest, no acute process. Electronically Signed   By: Ozell Daring M.D.   On: 02/04/2024 16:34    Procedures Procedures (including critical care time)  Medications Ordered in UC Medications  dexamethasone  (DECADRON ) injection 10 mg (10 mg Intramuscular Given 02/04/24 1702)    Initial Impression / Assessment and Plan / UC Course  I have reviewed the triage vital signs and the nursing notes.  Pertinent labs & imaging results that were available during my care of the  patient were reviewed by me and considered in my medical decision making (see chart for details).     Acute bronchitis-patient x-ray without any acute findings.  Exam consistent with acute bronchitis.  Will treat at this time with azithromycin  and Hycodan cough syrup.  Medications as prescribed.  Steroid injection given here today. , Hydrate and follow-up as needed Final Clinical Impressions(s) / UC Diagnoses   Final diagnoses:  Acute cough  Acute bronchitis, unspecified organism     Discharge Instructions      Treating you for bronchitis.  Take medications as prescribed.  Steroid injection given here today.  Follow-up as needed    ED Prescriptions     Medication Sig Dispense Auth. Provider   azithromycin  (ZITHROMAX ) 250 MG tablet Take 2 tablets (500 mg total) by mouth daily for 1 day, THEN 1 tablet (250 mg total) daily for 4 days. 6 tablet Ryota Treece A, FNP   HYDROcodone  bit-homatropine (HYCODAN) 5-1.5 MG/5ML syrup Take 5 mLs by mouth every 6 (six) hours as needed for cough. 120 mL Adah Corning A, FNP      PDMP not reviewed this encounter.   Adah Corning LABOR, FNP 02/05/24 [phone]

## 2024-02-04 NOTE — Discharge Instructions (Signed)
 Treating you for bronchitis.  Take medications as prescribed.  Steroid injection given here today.  Follow-up as needed

## 2024-02-04 NOTE — ED Triage Notes (Signed)
 Patient states onset of cough on Sunday.  Productive. Low grade fever. Patient has hx of 4 Bypass 6 years ago. Patient denies  sinus drainage. Has not received flu vaccination

## 2024-03-06 ENCOUNTER — Other Ambulatory Visit: Payer: Self-pay | Admitting: Cardiovascular Disease

## 2024-03-06 ENCOUNTER — Other Ambulatory Visit (HOSPITAL_COMMUNITY): Payer: Self-pay

## 2024-03-06 ENCOUNTER — Other Ambulatory Visit: Payer: Self-pay

## 2024-03-06 MED ORDER — LISINOPRIL 5 MG PO TABS
5.0000 mg | ORAL_TABLET | Freq: Every day | ORAL | 0 refills | Status: AC
  Filled 2024-03-06: qty 90, 90d supply, fill #0
